# Patient Record
Sex: Female | Born: 1995
Health system: Southern US, Community
[De-identification: ages and names within clinical notes are randomized; demographics above are authoritative.]

## PROBLEM LIST (undated history)

## (undated) DIAGNOSIS — G809 Cerebral palsy, unspecified: Secondary | ICD-10-CM

## (undated) HISTORY — PX: BACK SURGERY: SHX140

---
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
## 2016-01-07 DIAGNOSIS — G808 Other cerebral palsy: Secondary | ICD-10-CM | POA: Diagnosis not present

## 2016-01-17 DIAGNOSIS — G800 Spastic quadriplegic cerebral palsy: Secondary | ICD-10-CM | POA: Diagnosis not present

## 2016-02-19 DIAGNOSIS — G808 Other cerebral palsy: Secondary | ICD-10-CM | POA: Diagnosis not present

## 2016-02-29 ENCOUNTER — Ambulatory Visit (HOSPITAL_COMMUNITY)
Admission: EM | Admit: 2016-02-29 | Discharge: 2016-02-29 | Disposition: A | Discharge status: Home / Self Care | Payer: Federal, State, Local not specified - PPO | Attending: Emergency Medicine | Admitting: Emergency Medicine

## 2016-02-29 ENCOUNTER — Encounter (HOSPITAL_COMMUNITY): Payer: Self-pay | Admitting: Emergency Medicine

## 2016-02-29 DIAGNOSIS — R059 Cough, unspecified: Secondary | ICD-10-CM

## 2016-02-29 DIAGNOSIS — R05 Cough: Secondary | ICD-10-CM | POA: Diagnosis not present

## 2016-02-29 DIAGNOSIS — T700XXA Otitic barotrauma, initial encounter: Secondary | ICD-10-CM | POA: Diagnosis not present

## 2016-02-29 HISTORY — DX: Cerebral palsy, unspecified: G80.9

## 2016-02-29 MED ORDER — CETIRIZINE HCL 5 MG/5ML PO SYRP: 10.0000 mg | ORAL_SOLUTION | Freq: Every day | ORAL | Status: DC

## 2016-02-29 MED ORDER — FLUTICASONE PROPIONATE 50 MCG/ACT NA SUSP: 2.0000 | Freq: Every day | NASAL | Status: DC

## 2016-02-29 NOTE — Discharge Instructions (Signed)
I suspect she is developing an upper respiratory viral infection. There is no sign of pneumonia or ear infection. Give her cetirizine 10 mL daily. Use the Flonase daily. You can alternate Tylenol and ibuprofen as needed for pain. If she develops fevers, vomiting, trouble breathing, or is getting a lot worse, please come back.

## 2016-02-29 NOTE — ED Provider Notes (Signed)
CSN: [id number]     Arrival date & time 02/29/16  1933 History   First MD Initiated Contact with Patient 02/29/16 1943     Chief Complaint  Patient presents with  . Cough   (Consider location/radiation/quality/duration/timing/severity/associated sxs/prior Treatment) HPI  She is an 20 year old woman here with her mom for evaluation of cough and ear pain. History is obtained through the mother. Mom states she developed a cough and bilateral ear pain this afternoon after school. No fevers, nasal congestion, rhinorrhea, sore throat. She denies any shortness of breath or wheezing. No nausea or vomiting.  Past Medical History  Diagnosis Date  . Cerebral palsy [facility](HCC)    Past Surgical History  Procedure Laterality Date  . Back surgery     No family history on file. Social History  Substance Use Topics  . Smoking status: None  . Smokeless tobacco: None  . Alcohol Use: None   OB History    No data available     Review of Systems As in history of present illness Allergies  Review of patient's allergies indicates no known allergies.  Home Medications   Prior to Admission medications   Medication Sig Start Date End Date Taking? Authorizing Provider  baclofen (LIORESAL) 10 MG/5ML SOLN injection by Intrathecal route once.   Yes Historical Provider, MD  glycopyrrolate (ROBINUL) 1 MG tablet Take 1 mg by mouth 3 (three) times daily.   Yes Historical Provider, MD  cetirizine HCl (ZYRTEC) 5 MG/5ML SYRP Take 10 mLs (10 mg total) by mouth daily. 02/29/16   Charm RingsErin J Lachele Lievanos, MD  fluticasone (FLONASE) 50 MCG/ACT nasal spray Place 2 sprays into both nostrils daily. 02/29/16   Charm RingsErin J Shakthi Scipio, MD   Meds Ordered and Administered this Visit  Medications - No data to display  BP 148/95 mmHg  Pulse 120  Temp(Src) 98.1 F (36.7 C) (Oral)  Resp 22  SpO2 100% No data found.   Physical Exam  Constitutional: She is oriented to person, place, and time. She appears well-developed and well-nourished. No  distress.  In a wheelchair with spastic cerebral palsy.  HENT:  Nose: Nose normal.  Mouth/Throat: Oropharynx is clear and moist. No oropharyngeal exudate.  TMs normal bilaterally.  Neck: Neck supple.  Cardiovascular: Normal rate, regular rhythm and normal heart sounds.   No murmur heard. Pulmonary/Chest: Effort normal and breath sounds normal. No respiratory distress. She has no wheezes. She has no rales.  Lymphadenopathy:    She has no cervical adenopathy.  Neurological: She is alert and oriented to person, place, and time.    ED Course  Procedures (including critical care time)  Labs Review Labs Reviewed - No data to display  Imaging Review No results found.   MDM   1. Barotitis, initial encounter   2. Cough    I suspect this may be the start of a viral upper respiratory infection. Recommended cetirizine and Flonase. Mom will observe at home. Return precautions reviewed.    Charm RingsErin J Keonia Pasko, MD 02/29/16 2004

## 2016-02-29 NOTE — ED Notes (Signed)
Mom brings pt in for cough onset yest associated w/bilateral ear pain Mom denies fevers chills.... Pt hx of cerebral palsy... Alert... No acute distress.

## 2016-03-01 DIAGNOSIS — J028 Acute pharyngitis due to other specified organisms: Secondary | ICD-10-CM | POA: Diagnosis not present

## 2016-03-26 DIAGNOSIS — G800 Spastic quadriplegic cerebral palsy: Secondary | ICD-10-CM | POA: Diagnosis not present

## 2016-04-02 DIAGNOSIS — G800 Spastic quadriplegic cerebral palsy: Secondary | ICD-10-CM | POA: Diagnosis not present

## 2016-04-09 DIAGNOSIS — G800 Spastic quadriplegic cerebral palsy: Secondary | ICD-10-CM | POA: Diagnosis not present

## 2016-04-16 DIAGNOSIS — G800 Spastic quadriplegic cerebral palsy: Secondary | ICD-10-CM | POA: Diagnosis not present

## 2016-04-23 DIAGNOSIS — G800 Spastic quadriplegic cerebral palsy: Secondary | ICD-10-CM | POA: Diagnosis not present

## 2016-05-14 DIAGNOSIS — G800 Spastic quadriplegic cerebral palsy: Secondary | ICD-10-CM | POA: Diagnosis not present

## 2016-06-04 DIAGNOSIS — G800 Spastic quadriplegic cerebral palsy: Secondary | ICD-10-CM | POA: Diagnosis not present

## 2016-06-11 DIAGNOSIS — G800 Spastic quadriplegic cerebral palsy: Secondary | ICD-10-CM | POA: Diagnosis not present

## 2016-06-18 DIAGNOSIS — G800 Spastic quadriplegic cerebral palsy: Secondary | ICD-10-CM | POA: Diagnosis not present

## 2016-06-23 DIAGNOSIS — K080 Exfoliation of teeth due to systemic causes: Secondary | ICD-10-CM | POA: Diagnosis not present

## 2016-06-25 DIAGNOSIS — G800 Spastic quadriplegic cerebral palsy: Secondary | ICD-10-CM | POA: Diagnosis not present

## 2016-07-02 DIAGNOSIS — G800 Spastic quadriplegic cerebral palsy: Secondary | ICD-10-CM | POA: Diagnosis not present

## 2016-07-04 DIAGNOSIS — Z23 Encounter for immunization: Secondary | ICD-10-CM | POA: Diagnosis not present

## 2016-07-09 DIAGNOSIS — G800 Spastic quadriplegic cerebral palsy: Secondary | ICD-10-CM | POA: Diagnosis not present

## 2016-07-16 DIAGNOSIS — G800 Spastic quadriplegic cerebral palsy: Secondary | ICD-10-CM | POA: Diagnosis not present

## 2016-07-30 DIAGNOSIS — G800 Spastic quadriplegic cerebral palsy: Secondary | ICD-10-CM | POA: Diagnosis not present

## 2016-08-06 DIAGNOSIS — G800 Spastic quadriplegic cerebral palsy: Secondary | ICD-10-CM | POA: Diagnosis not present

## 2016-08-13 DIAGNOSIS — G800 Spastic quadriplegic cerebral palsy: Secondary | ICD-10-CM | POA: Diagnosis not present

## 2016-08-14 DIAGNOSIS — L709 Acne, unspecified: Secondary | ICD-10-CM | POA: Diagnosis not present

## 2016-08-20 DIAGNOSIS — G800 Spastic quadriplegic cerebral palsy: Secondary | ICD-10-CM | POA: Diagnosis not present

## 2016-09-10 DIAGNOSIS — G800 Spastic quadriplegic cerebral palsy: Secondary | ICD-10-CM | POA: Diagnosis not present

## 2016-09-17 DIAGNOSIS — G800 Spastic quadriplegic cerebral palsy: Secondary | ICD-10-CM | POA: Diagnosis not present

## 2016-09-25 DIAGNOSIS — G800 Spastic quadriplegic cerebral palsy: Secondary | ICD-10-CM | POA: Diagnosis not present

## 2016-10-08 DIAGNOSIS — G800 Spastic quadriplegic cerebral palsy: Secondary | ICD-10-CM | POA: Diagnosis not present

## 2016-10-15 DIAGNOSIS — G800 Spastic quadriplegic cerebral palsy: Secondary | ICD-10-CM | POA: Diagnosis not present

## 2016-10-29 DIAGNOSIS — G800 Spastic quadriplegic cerebral palsy: Secondary | ICD-10-CM | POA: Diagnosis not present

## 2016-11-12 DIAGNOSIS — G800 Spastic quadriplegic cerebral palsy: Secondary | ICD-10-CM | POA: Diagnosis not present

## 2016-12-03 DIAGNOSIS — G800 Spastic quadriplegic cerebral palsy: Secondary | ICD-10-CM | POA: Diagnosis not present

## 2016-12-10 ENCOUNTER — Encounter (HOSPITAL_COMMUNITY): Payer: Self-pay | Admitting: *Deleted

## 2016-12-10 ENCOUNTER — Ambulatory Visit (HOSPITAL_COMMUNITY)
Admission: EM | Admit: 2016-12-10 | Discharge: 2016-12-10 | Disposition: A | Discharge status: Home / Self Care | Payer: Federal, State, Local not specified - PPO

## 2016-12-10 DIAGNOSIS — J069 Acute upper respiratory infection, unspecified: Secondary | ICD-10-CM

## 2016-12-10 NOTE — ED Provider Notes (Signed)
CSN: [id number]     Arrival date & time 12/10/16  1652 History   First MD Initiated Contact with Patient 12/10/16 1834     No chief complaint on file.  (Consider location/radiation/quality/duration/timing/severity/associated sxs/prior Treatment) 21 year old female with cerebral palsy brought in by her caregiver with concern over bilateral ear pain, sore throat and cough that started today. Denies any fever, nasal congestion or GI symptoms. Has not taken anything yet for symptoms. Occasionally takes Flonase for seasonal allergies or URI.    The history is provided by a caregiver.    Past Medical History:  Diagnosis Date  . Cerebral palsy [facility])    Past Surgical History:  Procedure Laterality Date  . BACK SURGERY     History reviewed. No pertinent family history. Social History  Substance Use Topics  . Smoking status: Never Smoker  . Smokeless tobacco: Never Used  . Alcohol use No   OB History    No data available     Review of Systems  Constitutional: Negative for appetite change, chills, fatigue and fever.  HENT: Positive for ear pain and sore throat. Negative for congestion, ear discharge, postnasal drip, sinus pain and sinus pressure.   Eyes: Negative for discharge.  Respiratory: Positive for cough. Negative for chest tightness and wheezing.   Cardiovascular: Negative for chest pain.  Gastrointestinal: Negative for diarrhea, nausea and vomiting.  Musculoskeletal: Negative for arthralgias, back pain, myalgias and neck pain.  Skin: Negative for rash and wound.  Neurological: Negative for syncope and headaches.  Hematological: Negative for adenopathy.    Allergies  Patient has no known allergies.  Home Medications   Prior to Admission medications   Medication Sig Start Date End Date Taking? Authorizing Provider  clindamycin (CLEOCIN T) 1 % lotion Apply topically 2 (two) times daily.   Yes Historical Provider, MD  fluticasone (FLONASE) 50 MCG/ACT nasal spray Place 2  sprays into both nostrils daily. 02/29/16   Charm Rings, MD  glycopyrrolate (ROBINUL) 1 MG tablet Take 1 mg by mouth 3 (three) times daily.    Historical Provider, MD   Meds Ordered and Administered this Visit  Medications - No data to display  BP 135/78 (BP Location: Left Arm)   Pulse 100   Temp 98.6 F (37 C) (Oral)   Resp 18   LMP 12/09/2016  No data found.   Physical Exam  Constitutional: She appears well-developed and well-nourished. No distress.  Patient resting comfortably in her wheelchair.   HENT:  Head: Atraumatic. Macrocephalic.  Right Ear: Hearing, external ear and ear canal normal. Tympanic membrane is bulging.  Left Ear: Hearing, external ear and ear canal normal. Tympanic membrane is bulging.  Nose: Rhinorrhea present. Right sinus exhibits no maxillary sinus tenderness and no frontal sinus tenderness. Left sinus exhibits no maxillary sinus tenderness and no frontal sinus tenderness.  Mouth/Throat: Uvula is midline and mucous membranes are normal. Posterior oropharyngeal erythema present. Oropharyngeal exudate: clear to yellow post nasal drainage.  Neck: Normal range of motion. Neck supple.  Cardiovascular: Normal rate.   Murmur heard. Pulmonary/Chest: Effort normal and breath sounds normal. No respiratory distress. She has no decreased breath sounds. She has no wheezes. She has no rhonchi.  Lymphadenopathy:    She has no cervical adenopathy.  Neurological: She is alert. She displays atrophy. She exhibits abnormal muscle tone. Coordination abnormal.  Skin: Skin is warm and dry.  Psychiatric: She has a normal mood and affect.    Urgent Care Course  Procedures (including critical care time)  Labs Review Labs Reviewed - No data to display  Imaging Review No results found.   Visual Acuity Review  Right Eye Distance:   Left Eye Distance:   Bilateral Distance:    Right Eye Near:   Left Eye Near:    Bilateral Near:         MDM   1. Upper  respiratory tract infection, unspecified type    Reviewed with caregiver that she probably has a viral illness. Recommend try OTC Triaminic cold medication as directed to help with symptoms. May take Ibuprofen 600mg  every 8 hours as needed for ear pain and sore throat. Rest. May Continue Flonase as directed. Follow-up in 4 to 5 days if not improving.     Sudie GrumblingAnn Berry Lucciana Head, NP 12/10/16 2308

## 2016-12-10 NOTE — Discharge Instructions (Signed)
Recommend take liquid Triaminic cold and cough medication (with a decongestant) as directed to help with symptoms. Rest. Continue Flonase as directed. Follow-up if not improving in 4 to 5 days.

## 2016-12-17 DIAGNOSIS — G800 Spastic quadriplegic cerebral palsy: Secondary | ICD-10-CM | POA: Diagnosis not present

## 2016-12-22 DIAGNOSIS — K080 Exfoliation of teeth due to systemic causes: Secondary | ICD-10-CM | POA: Diagnosis not present

## 2016-12-24 DIAGNOSIS — G800 Spastic quadriplegic cerebral palsy: Secondary | ICD-10-CM | POA: Diagnosis not present

## 2016-12-31 DIAGNOSIS — G800 Spastic quadriplegic cerebral palsy: Secondary | ICD-10-CM | POA: Diagnosis not present

## 2017-01-12 DIAGNOSIS — G808 Other cerebral palsy: Secondary | ICD-10-CM | POA: Diagnosis not present

## 2017-01-14 DIAGNOSIS — G800 Spastic quadriplegic cerebral palsy: Secondary | ICD-10-CM | POA: Diagnosis not present

## 2017-01-21 DIAGNOSIS — G800 Spastic quadriplegic cerebral palsy: Secondary | ICD-10-CM | POA: Diagnosis not present

## 2017-02-11 DIAGNOSIS — G800 Spastic quadriplegic cerebral palsy: Secondary | ICD-10-CM | POA: Diagnosis not present

## 2017-02-18 DIAGNOSIS — G800 Spastic quadriplegic cerebral palsy: Secondary | ICD-10-CM | POA: Diagnosis not present

## 2017-02-19 DIAGNOSIS — G808 Other cerebral palsy: Secondary | ICD-10-CM | POA: Diagnosis not present

## 2017-02-25 DIAGNOSIS — G800 Spastic quadriplegic cerebral palsy: Secondary | ICD-10-CM | POA: Diagnosis not present

## 2017-03-04 DIAGNOSIS — G800 Spastic quadriplegic cerebral palsy: Secondary | ICD-10-CM | POA: Diagnosis not present

## 2017-03-13 ENCOUNTER — Ambulatory Visit (HOSPITAL_COMMUNITY): Payer: Federal, State, Local not specified - PPO

## 2017-03-13 ENCOUNTER — Ambulatory Visit (INDEPENDENT_AMBULATORY_CARE_PROVIDER_SITE_OTHER): Payer: Federal, State, Local not specified - PPO

## 2017-03-13 ENCOUNTER — Ambulatory Visit (HOSPITAL_COMMUNITY)
Admission: EM | Admit: 2017-03-13 | Discharge: 2017-03-13 | Disposition: A | Discharge status: Home / Self Care | Payer: Federal, State, Local not specified - PPO | Attending: Emergency Medicine | Admitting: Emergency Medicine

## 2017-03-13 ENCOUNTER — Encounter (HOSPITAL_COMMUNITY): Payer: Self-pay | Admitting: *Deleted

## 2017-03-13 DIAGNOSIS — G808 Other cerebral palsy: Secondary | ICD-10-CM | POA: Diagnosis not present

## 2017-03-13 DIAGNOSIS — J302 Other seasonal allergic rhinitis: Secondary | ICD-10-CM

## 2017-03-13 DIAGNOSIS — J301 Allergic rhinitis due to pollen: Secondary | ICD-10-CM | POA: Diagnosis not present

## 2017-03-13 DIAGNOSIS — R05 Cough: Secondary | ICD-10-CM

## 2017-03-13 DIAGNOSIS — R059 Cough, unspecified: Secondary | ICD-10-CM | POA: Diagnosis present

## 2017-03-13 NOTE — ED Triage Notes (Signed)
Pt  Reports   Symptoms     Of  Cough   And      Congested   For   sev  Days    The  Cough is  Non  Productive          Pt  Is   Wheelchair      Bound

## 2017-03-13 NOTE — ED Provider Notes (Signed)
CSN: [id number]     Arrival date & time 03/13/17  1121 History   None    Chief Complaint  Patient presents with  . Cough   (Consider location/radiation/quality/duration/timing/severity/associated sxs/prior Treatment) The history is provided by the patient. No language interpreter was used.  Cough  Cough characteristics:  Non-productive and dry Severity:  Mild Onset quality:  Gradual Timing:  Intermittent Progression:  Waxing and waning Chronicity:  Recurrent Smoker: no   Context: weather changes   Relieved by:  Nothing Worsened by:  Nothing Ineffective treatments:  Cough suppressants Associated symptoms: sinus congestion   Associated symptoms: no chills, no fever and no wheezing     Past Medical History:  Diagnosis Date  . Cerebral palsy [facility])    Past Surgical History:  Procedure Laterality Date  . BACK SURGERY     History reviewed. No pertinent family history. Social History  Substance Use Topics  . Smoking status: Never Smoker  . Smokeless tobacco: Never Used  . Alcohol use No   OB History    No data available     Review of Systems  Constitutional: Negative for chills and fever.  HENT: Negative.   Eyes: Negative.   Respiratory: Positive for cough. Negative for wheezing.   Cardiovascular: Negative.   Gastrointestinal: Negative.   Endocrine: Negative.   Genitourinary: Negative.   Musculoskeletal: Negative.   Skin: Negative.   Allergic/Immunologic: Negative.   Neurological: Negative.   Hematological: Negative.   Psychiatric/Behavioral: Negative.   All other systems reviewed and are negative.   Allergies  Patient has no known allergies.  Home Medications   Prior to Admission medications   Medication Sig Start Date End Date Taking? Authorizing Provider  clindamycin (CLEOCIN T) 1 % lotion Apply topically 2 (two) times daily.    [provider]  fluticasone (FLONASE) 50 MCG/ACT nasal spray Place 2 sprays into both nostrils daily. 02/29/16    Charm Rings, MD  glycopyrrolate (ROBINUL) 1 MG tablet Take 1 mg by mouth 3 (three) times daily.    [provider]   Meds Ordered and Administered this Visit  Medications - No data to display  BP 108/60 (BP Location: Left Arm)   Pulse (!) 102   Temp 98.6 F (37 C) (Oral)   Resp 18   LMP 03/09/2017 (Exact Date)   SpO2 100%  No data found.   Physical Exam  Constitutional: She is oriented to person, place, and time. She appears well-nourished. She is active and cooperative. No distress.  HENT:  Head: Normocephalic.  Right Ear: Tympanic membrane normal.  Left Ear: Tympanic membrane normal.  Nose: Mucosal edema present.  Mouth/Throat: Uvula is midline, oropharynx is clear and moist and mucous membranes are normal.  Eyes: Conjunctivae, EOM and lids are normal. Pupils are equal, round, and reactive to light.  Neck: Normal range of motion. No tracheal deviation present.  Cardiovascular: Regular rhythm, normal heart sounds and normal pulses.   No murmur heard. Pulmonary/Chest: Effort normal and breath sounds normal.  Abdominal: Soft. Bowel sounds are normal. There is no tenderness.  Musculoskeletal: Normal range of motion.  Lymphadenopathy:    She has no cervical adenopathy.  Neurological: She is alert and oriented to person, place, and time. GCS eye subscore is 4. GCS verbal subscore is 5. GCS motor subscore is 6.  Skin: Skin is warm and dry. No rash noted.  Psychiatric: She has a normal mood and affect. Her speech is normal and behavior is normal.  Nursing note and  vitals reviewed.   Urgent Care Course     Procedures (including critical care time)  Labs Review Labs Reviewed - No data to display  Imaging Review Dg Chest 1 View  Result Date: 03/13/2017 CLINICAL DATA:  Cough EXAM: CHEST 1 VIEW COMPARISON:  None. FINDINGS: Lungs are clear. No effusion or pneumothorax. Prominence of the main pulmonary artery contour, but hilar vessels are unremarkable and there is  normal vascularity. Spinal fixation hardware. History of cerebral palsy. IMPRESSION: No acute finding.  No evidence of pneumonia. Electronically Signed   By: Marnee SpringJonathon  Watts M.D.   On: 03/13/2017 12:53        MDM   1. Seasonal allergic rhinitis due to pollen   2. Cough     Your CXR was negative for pneumonia. Rest,push fluids, take OTC allergy med of choice(zyrtec, Claritin, allegra, etc). Follow up with PCP for general medical issues/recheck. Return to UC as needed.    Clancy Gourdefelice, Yun Gutierrez, NP 03/13/17 1329

## 2017-03-13 NOTE — Discharge Instructions (Signed)
Your CXR was negative for pneumonia. Rest,push fluids, take OTC allergy med of choice(zyrtec, Claritin, allegra, etc). Follow up with PCP for general medical issues/recheck. Return to UC as needed.

## 2017-03-18 DIAGNOSIS — G800 Spastic quadriplegic cerebral palsy: Secondary | ICD-10-CM | POA: Diagnosis not present

## 2017-03-25 DIAGNOSIS — G800 Spastic quadriplegic cerebral palsy: Secondary | ICD-10-CM | POA: Diagnosis not present

## 2017-04-10 DIAGNOSIS — G800 Spastic quadriplegic cerebral palsy: Secondary | ICD-10-CM | POA: Diagnosis not present

## 2017-04-13 ENCOUNTER — Ambulatory Visit (INDEPENDENT_AMBULATORY_CARE_PROVIDER_SITE_OTHER): Payer: Federal, State, Local not specified - PPO | Admitting: Family Medicine

## 2017-04-13 ENCOUNTER — Encounter: Payer: Self-pay | Admitting: Family Medicine

## 2017-04-13 ENCOUNTER — Telehealth: Payer: Self-pay

## 2017-04-13 ENCOUNTER — Ambulatory Visit (INDEPENDENT_AMBULATORY_CARE_PROVIDER_SITE_OTHER): Payer: Federal, State, Local not specified - PPO

## 2017-04-13 VITALS — BP 112/70 | HR 80 | Temp 98.4°F | Wt <= 1120 oz

## 2017-04-13 DIAGNOSIS — H6592 Unspecified nonsuppurative otitis media, left ear: Secondary | ICD-10-CM

## 2017-04-13 DIAGNOSIS — R05 Cough: Secondary | ICD-10-CM

## 2017-04-13 DIAGNOSIS — R059 Cough, unspecified: Secondary | ICD-10-CM

## 2017-04-13 MED ORDER — CETIRIZINE HCL 5 MG/5ML PO SOLN: 10.0000 mg | Freq: Every day | ORAL | 2 refills | Status: AC

## 2017-04-13 MED ORDER — AZITHROMYCIN 200 MG/5ML PO SUSR: 250.0000 mg | Freq: Every day | ORAL | 0 refills | Status: AC

## 2017-04-13 NOTE — Telephone Encounter (Signed)
Patient's pharmacy called to ask how many days the patient is supposed to take azithromycin for.  Per Dr. Earlene PlaterWallace, 5 days.  Pharmacy notified.

## 2017-04-13 NOTE — Progress Notes (Signed)
Renee Everett is a 21 y.o. female is here to [facility].   Patient Care Team: Renee Rima, DO as PCP - General (Family Medicine)   History of Present Illness:   Joseph Art, CMA, acting as scribe for Dr. Earlene Plater.  Cough  This is a new problem. The current episode started more than 1 month ago. The problem has been unchanged. The problem occurs every few hours. The cough is non-productive. Pertinent negatives include no chest pain, chills, fever, nasal congestion or sore throat. The symptoms are aggravated by lying down. She has tried OTC cough suppressant for the symptoms. The treatment provided no relief.   Health Maintenance Due  Topic Date Due  . HIV Screening  07/31/2012  . TETANUS/TDAP  07/31/2016   PMHx, SurgHx, SocialHx, Medications, and Allergies were reviewed in the Visit Navigator and updated as appropriate.   Past Medical History:  Diagnosis Date  . Cerebral palsy [facility])    Past Surgical History:  Procedure Laterality Date  . BACK SURGERY      Family History  Problem Relation Age of Onset  . Hypertension Father    Social History  Substance Use Topics  . Smoking status: Never Smoker  . Smokeless tobacco: Never Used  . Alcohol use No   Current Medications and Allergies:   .  clindamycin (CLEOCIN T) 1 % lotion, Apply topically 2 (two) times daily., Disp: , Rfl:  .  glycopyrrolate (ROBINUL) 1 MG tablet, Take 1 mg by mouth 3 (three) times daily., Disp: , Rfl:   No Known Allergies   Review of Systems:   Review of Systems  Constitutional: Negative for chills and fever.  HENT: Negative for sore throat.   Respiratory: Positive for cough.   Cardiovascular: Negative for chest pain.   Vitals:   Vitals:   04/13/17 0951  BP: 112/70  Pulse: 80  Temp: 98.4 F (36.9 C)  TempSrc: Oral  SpO2: 97%  Weight: 65 lb (29.5 kg)     There is no height or weight on file to calculate BMI.  Physical Exam:   Physical Exam  Constitutional: She appears  well-nourished. No distress.  CP, alert, in wheelchair.  HENT:  Head: Normocephalic and atraumatic.  Right Ear: External ear normal.  Left Ear: Tympanic membrane is bulging.  Nose: Rhinorrhea present.  Mouth/Throat: Oropharynx is clear and moist and mucous membranes are normal.  Eyes: Pupils are equal, round, and reactive to light. EOM are normal.  Neck: Normal range of motion. Neck supple.  Cardiovascular: Normal rate, regular rhythm, normal heart sounds and intact distal pulses.   Pulmonary/Chest: Effort normal. No respiratory distress. She has no wheezes.  Abdominal: Soft. Normal appearance and bowel sounds are normal.  No PEG.  Musculoskeletal:  Contractures arms and legs.  Neurological: She is alert.  Skin: Skin is warm. No rash noted.  Psychiatric: She has a normal mood and affect. Her behavior is normal.  Nursing note and vitals reviewed.  EXAM: CHEST  2 VIEW  COMPARISON:  03/13/2017  FINDINGS: Heart and mediastinal contours are within normal limits. No focal opacities or effusions. No acute bony abnormality. Posterior spinal rods in place.  IMPRESSION: No active cardiopulmonary disease.  Assessment and Plan:   Marlean was seen today for establish care and cough.  Diagnoses and all orders for this visit:  Cough Comments: Prolonged cough with OM at this point. Will cover with atypical Abx below. Red flags reviewed. Ray reassuring. Aspiration precautions reviewed. Orders: -  DG Chest 2 View; Future -     azithromycin (ZITHROMAX) 200 MG/5ML suspension; Take 6.3 mLs (250 mg total) by mouth daily. -     cetirizine HCl (ZYRTEC) 5 MG/5ML SOLN; Take 10 mLs (10 mg total) by mouth daily.  OME (otitis media with effusion), left -     azithromycin (ZITHROMAX) 200 MG/5ML suspension; Take 6.3 mLs (250 mg total) by mouth daily.   . Reviewed expectations re: course of current medical issues. . Discussed self-management of symptoms. . Outlined signs and symptoms  indicating need for more acute intervention. . Patient verbalized understanding and all questions were answered. Marland Kitchen. Health Maintenance issues including appropriate healthy diet, exercise, and smoking avoidance were discussed with patient. . See orders for this visit as documented in the electronic medical record. . Patient received an After Visit Summary.  CMA served as Neurosurgeonscribe during this visit. History, Physical, and Plan performed by medical provider. The above documentation has been reviewed and is accurate and complete. Renee RimaErica Cyara Devoto, D.O.   Renee RimaErica Tatelyn Vanhecke, DO [place] [facility], [facility] 04/18/2017

## 2017-04-15 DIAGNOSIS — G800 Spastic quadriplegic cerebral palsy: Secondary | ICD-10-CM | POA: Diagnosis not present

## 2017-04-22 ENCOUNTER — Telehealth: Payer: Self-pay | Admitting: Family Medicine

## 2017-04-22 DIAGNOSIS — G800 Spastic quadriplegic cerebral palsy: Secondary | ICD-10-CM | POA: Diagnosis not present

## 2017-04-22 NOTE — Telephone Encounter (Signed)
Spoke with patients mother and she stated that her daughter still has the cough. She states that her daughter is sleeping more now as well. She finished the antibiotic and cough went away for about a day then came back as a hacking cough. Denies any fever and ear pain. She did say that she has noticed that she is itching a lot. Please advise.

## 2017-04-22 NOTE — Telephone Encounter (Signed)
Patient's mother calling with concern about patient. States after taking all of the antibiotics she was prescribed, patient is still not feeling better. Asking for return call to discuss next course of action.

## 2017-04-23 NOTE — Telephone Encounter (Signed)
Noted  

## 2017-04-23 NOTE — Telephone Encounter (Signed)
Patient's mother called in reference to note below. Informed patient's mother that Renee Everett was out of the office for the rest of the afternoon and will be returning in the morning.

## 2017-04-23 NOTE — Telephone Encounter (Signed)
Call first thing in the morning to check in. If she can - okay to work in. If not - ask for an update.

## 2017-04-24 ENCOUNTER — Encounter: Payer: Self-pay | Admitting: Physician Assistant

## 2017-04-24 ENCOUNTER — Ambulatory Visit (INDEPENDENT_AMBULATORY_CARE_PROVIDER_SITE_OTHER): Payer: Federal, State, Local not specified - PPO | Admitting: Physician Assistant

## 2017-04-24 VITALS — BP 104/62 | HR 72 | Temp 98.0°F

## 2017-04-24 DIAGNOSIS — R05 Cough: Secondary | ICD-10-CM | POA: Diagnosis not present

## 2017-04-24 DIAGNOSIS — R059 Cough, unspecified: Secondary | ICD-10-CM

## 2017-04-24 MED ORDER — RANITIDINE HCL 150 MG/10ML PO SYRP: 150.0000 mg | ORAL_SOLUTION | Freq: Two times a day (BID) | ORAL | 0 refills | Status: AC

## 2017-04-24 NOTE — Progress Notes (Signed)
Renee Everett is a 21 y.o. female here for a follow up of a pre-existing problem.  SCRIBE STATEMENT  History of Present Illness:   Chief Complaint  Patient presents with  . Cough    Has been treated with ABX. finished a week ago. Still has the cough.     HPI   Patient has had a cough x 2 months. She has been treated with Azithromycin and Zyrtec. CXR on 04/13/17 was negative. She completed the antibiotic dose pack. Was seen by her PCP for this on 04/13/17. Mom reports that patient continues to have lingering cough, worse at night. No more fevers. No productive cough. No cyanosis or SOB. She does have a history of heartburn, currently untreated. She does eat a lot of heavy and acidic foods such as pizza and lasagna. She often lays down right after eating. Denies changes in bowels/urine, n/v, abdominal pain.   Past Medical History:  Diagnosis Date  . Cerebral palsy [facility](HCC)      Social History   Social History  . Marital status: Single    Spouse name: N/A  . Number of children: N/A  . Years of education: N/A   Occupational History  . Not on file.   Social History Main Topics  . Smoking status: Never Smoker  . Smokeless tobacco: Never Used  . Alcohol use No  . Drug use: No  . Sexual activity: No   Other Topics Concern  . Not on file   Social History Narrative  . No narrative on file    Past Surgical History:  Procedure Laterality Date  . BACK SURGERY      Family History  Problem Relation Age of Onset  . Hypertension Father     No Known Allergies  Current Medications:   Current Outpatient Prescriptions:  .  cetirizine HCl (ZYRTEC) 5 MG/5ML SOLN, Take 10 mLs (10 mg total) by mouth daily., Disp: 300 mL, Rfl: 2 .  clindamycin (CLEOCIN T) 1 % lotion, Apply topically 2 (two) times daily., Disp: , Rfl:  .  glycopyrrolate (ROBINUL) 1 MG tablet, Take 1 mg by mouth 3 (three) times daily., Disp: , Rfl:  .  ranitidine (ZANTAC) 150 MG/10ML syrup, Take 10 mLs (150 mg total) by  mouth 2 (two) times daily., Disp: 300 mL, Rfl: 0   Review of Systems:   Review of Systems  Constitutional: Negative for chills, fever, malaise/fatigue and weight loss.  Respiratory: Positive for cough. Negative for hemoptysis, sputum production, shortness of breath and wheezing.   Cardiovascular: Negative for chest pain.  Gastrointestinal: Negative for abdominal pain, diarrhea, nausea and vomiting.  Genitourinary: Negative for dysuria.    Vitals:   Vitals:   04/24/17 1400  BP: 104/62  Pulse: 72  Temp: 98 F (36.7 C)  TempSrc: Oral  SpO2: 100%     There is no height or weight on file to calculate BMI.  Physical Exam:   Physical Exam  Constitutional: She appears well-developed. She is cooperative.  Non-toxic appearance. She does not have a sickly appearance. She does not appear ill. No distress.  Cardiovascular: Normal rate, regular rhythm, S1 normal, S2 normal, normal heart sounds and normal pulses.   No LE edema  Pulmonary/Chest: Effort normal and breath sounds normal.  Abdominal: Soft. Normal appearance and bowel sounds are normal. There is no rigidity, no rebound and no guarding.  Neurological: She is alert. GCS eye subscore is 4. GCS verbal subscore is 5. GCS motor subscore is 6.  Skin: Skin  is warm, dry and intact.  Psychiatric: She has a normal mood and affect. Her speech is normal and behavior is normal.  Nursing note and vitals reviewed.     Assessment and Plan:    Sophea was seen today for cough.  Diagnoses and all orders for this visit:  Cough  Other orders -     ranitidine (ZANTAC) 150 MG/10ML syrup; Take 10 mLs (150 mg total) by mouth 2 (two) times daily.   Will treat cough with liquid Zyrtec per orders. I have recommended small meals, and limiting heavy/acidic foods when possible. Avoid laying down right after eating. Consider use of Delsym or Mucinex -- discussed with mom. If cough persists despite treatment, would recommend pulmonary evaluation.  Follow-up prn with PCP.  Marland Kitchen Reviewed expectations re: course of current medical issues. . Discussed self-management of symptoms. . Outlined signs and symptoms indicating need for more acute intervention. . Patient verbalized understanding and all questions were answered. . See orders for this visit as documented in the electronic medical record. . Patient received an After-Visit Summary.  CMA or LPN served as scribe during this visit. History, Physical, and Plan performed by medical provider. Documentation and orders reviewed and attested to.  Jarold Motto, PA-C

## 2017-04-24 NOTE — Telephone Encounter (Signed)
I have scheduled patient to come in to see Lelon MastSamantha today at 2 PM.

## 2017-04-24 NOTE — Telephone Encounter (Signed)
Left message for patient to return call.

## 2017-04-24 NOTE — Patient Instructions (Signed)
It was great to meet you!  Start the Zantac twice a day, take before meals.  Consider Mucinex to help with chest congestion.  Follow-up with Dr. Earlene Plater if symptoms worsen or persist despite treatment.   Heartburn Heartburn is a type of pain or discomfort that can happen in the throat or chest. It is often described as a burning pain. It may also cause a bad taste in the mouth. Heartburn may feel worse when you lie down or bend over, and it is often worse at night. Heartburn may be caused by stomach contents that move back up into the esophagus (reflux). Follow these instructions at home: Take these actions to decrease your discomfort and to help avoid complications. Diet  Follow a diet as recommended by your health care provider. This may involve avoiding foods and drinks such as: ? Coffee and tea (with or without caffeine). ? Drinks that contain alcohol. ? Energy drinks and sports drinks. ? Carbonated drinks or sodas. ? Chocolate and cocoa. ? Peppermint and mint flavorings. ? Garlic and onions. ? Horseradish. ? Spicy and acidic foods, including peppers, chili powder, curry powder, vinegar, hot sauces, and barbecue sauce. ? Citrus fruit juices and citrus fruits, such as oranges, lemons, and limes. ? Tomato-based foods, such as red sauce, chili, salsa, and pizza with red sauce. ? Fried and fatty foods, such as donuts, french fries, potato chips, and high-fat dressings. ? High-fat meats, such as hot dogs and fatty cuts of red and white meats, such as rib eye steak, sausage, ham, and bacon. ? High-fat dairy items, such as whole milk, butter, and cream cheese.  Eat small, frequent meals instead of large meals.  Avoid drinking large amounts of liquid with your meals.  Avoid eating meals during the 2-3 hours before bedtime.  Avoid lying down right after you eat.  Do not exercise right after you eat. General instructions  Pay attention to any changes in your symptoms.  Take  over-the-counter and prescription medicines only as told by your health care provider. Do not take aspirin, ibuprofen, or other NSAIDs unless your health care provider told you to do so.  Do not use any tobacco products, including cigarettes, chewing tobacco, and e-cigarettes. If you need help quitting, ask your health care provider.  Wear loose-fitting clothing. Do not wear anything tight around your waist that causes pressure on your abdomen.  Raise (elevate) the head of your bed about 6 inches (15 cm).  Try to reduce your stress, such as with yoga or meditation. If you need help reducing stress, ask your health care provider.  If you are overweight, reduce your weight to an amount that is healthy for you. Ask your health care provider for guidance about a safe weight loss goal.  Keep all follow-up visits as told by your health care provider. This is important. Contact a health care provider if:  You have new symptoms.  You have unexplained weight loss.  You have difficulty swallowing, or it hurts to swallow.  You have wheezing or a persistent cough.  Your symptoms do not improve with treatment.  You have frequent heartburn for more than two weeks. Get help right away if:  You have pain in your arms, neck, jaw, teeth, or back.  You feel sweaty, dizzy, or light-headed.  You have chest pain or shortness of breath.  You vomit and your vomit looks like blood or coffee grounds.  Your stool is bloody or black. This information is not intended to replace  advice given to you by your health care provider. Make sure you discuss any questions you have with your health care provider. Document Released: 02/08/2009 Document Revised: 02/28/2016 Document Reviewed: 01/17/2015 Elsevier Interactive Patient Education  2017 [employer]

## 2017-04-29 DIAGNOSIS — G800 Spastic quadriplegic cerebral palsy: Secondary | ICD-10-CM | POA: Diagnosis not present

## 2017-05-06 DIAGNOSIS — G800 Spastic quadriplegic cerebral palsy: Secondary | ICD-10-CM | POA: Diagnosis not present

## 2017-05-12 DIAGNOSIS — Z83511 Family history of glaucoma: Secondary | ICD-10-CM | POA: Diagnosis not present

## 2017-05-12 DIAGNOSIS — H53022 Refractive amblyopia, left eye: Secondary | ICD-10-CM | POA: Diagnosis not present

## 2017-05-13 DIAGNOSIS — G800 Spastic quadriplegic cerebral palsy: Secondary | ICD-10-CM | POA: Diagnosis not present

## 2017-05-19 DIAGNOSIS — G800 Spastic quadriplegic cerebral palsy: Secondary | ICD-10-CM | POA: Diagnosis not present

## 2017-06-10 DIAGNOSIS — G800 Spastic quadriplegic cerebral palsy: Secondary | ICD-10-CM | POA: Diagnosis not present

## 2017-06-17 DIAGNOSIS — G800 Spastic quadriplegic cerebral palsy: Secondary | ICD-10-CM | POA: Diagnosis not present

## 2017-06-24 DIAGNOSIS — G800 Spastic quadriplegic cerebral palsy: Secondary | ICD-10-CM | POA: Diagnosis not present

## 2017-06-29 DIAGNOSIS — K080 Exfoliation of teeth due to systemic causes: Secondary | ICD-10-CM | POA: Diagnosis not present

## 2017-07-01 DIAGNOSIS — G800 Spastic quadriplegic cerebral palsy: Secondary | ICD-10-CM | POA: Diagnosis not present

## 2017-07-08 DIAGNOSIS — G800 Spastic quadriplegic cerebral palsy: Secondary | ICD-10-CM | POA: Diagnosis not present

## 2017-07-14 DIAGNOSIS — G800 Spastic quadriplegic cerebral palsy: Secondary | ICD-10-CM | POA: Diagnosis not present

## 2017-07-15 DIAGNOSIS — G800 Spastic quadriplegic cerebral palsy: Secondary | ICD-10-CM | POA: Diagnosis not present

## 2017-07-23 DIAGNOSIS — G800 Spastic quadriplegic cerebral palsy: Secondary | ICD-10-CM | POA: Diagnosis not present

## 2017-07-23 DIAGNOSIS — G808 Other cerebral palsy: Secondary | ICD-10-CM | POA: Diagnosis not present

## 2017-07-27 ENCOUNTER — Ambulatory Visit (INDEPENDENT_AMBULATORY_CARE_PROVIDER_SITE_OTHER): Payer: Federal, State, Local not specified - PPO | Admitting: *Deleted

## 2017-07-27 DIAGNOSIS — Z23 Encounter for immunization: Secondary | ICD-10-CM

## 2017-08-12 DIAGNOSIS — G800 Spastic quadriplegic cerebral palsy: Secondary | ICD-10-CM | POA: Diagnosis not present

## 2017-08-13 DIAGNOSIS — G800 Spastic quadriplegic cerebral palsy: Secondary | ICD-10-CM | POA: Diagnosis not present

## 2017-08-19 DIAGNOSIS — G800 Spastic quadriplegic cerebral palsy: Secondary | ICD-10-CM | POA: Diagnosis not present

## 2017-09-09 DIAGNOSIS — G800 Spastic quadriplegic cerebral palsy: Secondary | ICD-10-CM | POA: Diagnosis not present

## 2017-09-16 DIAGNOSIS — G800 Spastic quadriplegic cerebral palsy: Secondary | ICD-10-CM | POA: Diagnosis not present

## 2017-09-23 DIAGNOSIS — G800 Spastic quadriplegic cerebral palsy: Secondary | ICD-10-CM | POA: Diagnosis not present

## 2017-10-05 DIAGNOSIS — G800 Spastic quadriplegic cerebral palsy: Secondary | ICD-10-CM | POA: Diagnosis not present

## 2017-10-28 DIAGNOSIS — G800 Spastic quadriplegic cerebral palsy: Secondary | ICD-10-CM | POA: Diagnosis not present

## 2017-11-04 DIAGNOSIS — G800 Spastic quadriplegic cerebral palsy: Secondary | ICD-10-CM | POA: Diagnosis not present

## 2017-11-11 DIAGNOSIS — G800 Spastic quadriplegic cerebral palsy: Secondary | ICD-10-CM | POA: Diagnosis not present

## 2017-11-18 DIAGNOSIS — G800 Spastic quadriplegic cerebral palsy: Secondary | ICD-10-CM | POA: Diagnosis not present

## 2017-11-30 DIAGNOSIS — G800 Spastic quadriplegic cerebral palsy: Secondary | ICD-10-CM | POA: Diagnosis not present

## 2017-12-29 DIAGNOSIS — G800 Spastic quadriplegic cerebral palsy: Secondary | ICD-10-CM | POA: Diagnosis not present

## 2018-01-04 DIAGNOSIS — K080 Exfoliation of teeth due to systemic causes: Secondary | ICD-10-CM | POA: Diagnosis not present

## 2018-04-06 DIAGNOSIS — G800 Spastic quadriplegic cerebral palsy: Secondary | ICD-10-CM | POA: Diagnosis not present

## 2018-05-06 ENCOUNTER — Ambulatory Visit: Payer: Self-pay

## 2018-05-06 NOTE — Telephone Encounter (Signed)
Mom reports pt. Acting "her normal self - talking with people on her I-pad." No complaints. The only thing different is she "is a little sluggish."

## 2018-05-06 NOTE — Telephone Encounter (Signed)
See note

## 2018-05-06 NOTE — Telephone Encounter (Signed)
Mom reports pt.'s last BM was last week. Taking Miralax daily per Mom. No new medications, no diet changes.No complaints. "She seems sluggish." Has 2-3 BM's per week. "Eating and drinking - just not eating as much." Request appointment for tomorrow. Appointment made. Reason for Disposition . Last bowel movement (BM) > 4 days ago  Answer Assessment - Initial Assessment Questions 1. STOOL PATTERN OR FREQUENCY: "How often do you pass bowel movements (BMs)?"  (Normal range: tid to q 3 days)  "When was the last BM passed?"       2-3 times aweek 2. STRAINING: "Do you have to strain to have a BM?"      No 3. RECTAL PAIN: "Does your rectum hurt when the stool comes out?" If so, ask: "Do you have hemorrhoids? How bad is the pain?"  (Scale 1-10; or mild, moderate, severe)     No 4. STOOL COMPOSITION: "Are the stools hard?"      No 5. BLOOD ON STOOLS: "Has there been any blood on the toilet tissue or on the surface of the BM?" If so, ask: "When was the last time?"      No 6. CHRONIC CONSTIPATION: "Is this a new problem for you?"  If no, ask: "How long have you had this problem?" (days, weeks, months)      No 7. CHANGES IN DIET: "Have there been any recent changes in your diet?"      No 8. MEDICATIONS: "Have you been taking any new medications?"     No 9. LAXATIVES: "Have you been using any laxatives or enemas?"  If yes, ask "What, how often, and when was the last time?"     Miralax daily 10. CAUSE: "What do you think is causing the constipation?"        Unsure 11. OTHER SYMPTOMS: "Do you have any other symptoms?" (e.g., abdominal pain, fever, vomiting)       nOT AS ACTIVE 12. PREGNANCY: "Is there any chance you are pregnant?" "When was your last menstrual period?"       No  Protocols used: CONSTIPATION-A-AH

## 2018-05-06 NOTE — Telephone Encounter (Signed)
FYI

## 2018-05-07 ENCOUNTER — Encounter: Payer: Self-pay | Admitting: Physician Assistant

## 2018-05-07 ENCOUNTER — Ambulatory Visit (INDEPENDENT_AMBULATORY_CARE_PROVIDER_SITE_OTHER): Payer: Federal, State, Local not specified - PPO | Admitting: Physician Assistant

## 2018-05-07 VITALS — BP 120/80 | HR 100 | Temp 98.5°F | Wt <= 1120 oz

## 2018-05-07 DIAGNOSIS — K5900 Constipation, unspecified: Secondary | ICD-10-CM | POA: Diagnosis not present

## 2018-05-07 NOTE — Patient Instructions (Signed)
   Use enema today and may also use on Sunday if needed. Continue Miralax.  If no bowel movement by Monday, give our office a call. If any change in symptoms, please seek medical attention (especially if abdominal pain, vomiting, inability to eat/drink.)  Make sure you keep the anus lubricated with vaseline.   How should I use this medicine? This medicine is for rectal use only. Do not take by mouth. Follow the directions on the prescription label. Wash your hands before and after use. Remove tip from enema bottle. Gently insert enema tip into the rectum. Squeeze bottle until almost all of the medicine is inside the rectum. Remove enema tip from the rectum and stay in position until the urge to evacuate is strong. Do not take doses that are larger than those recommended on the product label or otherwise directed by your healthcare professional. Do not take more than one dose in 24 hours.

## 2018-05-07 NOTE — Progress Notes (Signed)
Renee Everett is a 22 y.o. female here for a recurrent problem.  I acted as a Neurosurgeon for [employer], PA-C Corky Mull, LPN  History of Present Illness:   Chief Complaint  Patient presents with  . Constipation    Constipation  This is a recurrent problem. Episode onset: Pt has not been able to move her bowels in 10 days. The problem has been gradually worsening since onset. Her stool frequency is 2 to 3 times per week. The stool is described as loose. The patient is not on a high fiber diet. She does not exercise regularly. There has not been adequate water intake. Associated symptoms include bloating. Pertinent negatives include no abdominal pain, back pain, diarrhea, difficulty urinating, fever, flatus, nausea or rectal pain. She has tried stool softeners (Miralax x 1 week ) for the symptoms. The treatment provided no relief.   This is the longest time that she has gone without having a bowel movement. She is currently on Robinul which may cause constipation -- mother reports that patient has been on this almost all of her life. She is still passing gas, and is eating and drinking, but is eating smaller amounts.  Past Medical History:  Diagnosis Date  . Cerebral palsy [facility])      Social History   Socioeconomic History  . Marital status: Single    Spouse name: Not on file  . Number of children: Not on file  . Years of education: Not on file  . Highest education level: Not on file  Occupational History  . Not on file  Social Needs  . Financial resource strain: Not on file  . Food insecurity:    Worry: Not on file    Inability: Not on file  . Transportation needs:    Medical: Not on file    Non-medical: Not on file  Tobacco Use  . Smoking status: Never Smoker  . Smokeless tobacco: Never Used  Substance and Sexual Activity  . Alcohol use: No  . Drug use: No  . Sexual activity: Never  Lifestyle  . Physical activity:    Days per week: Not on file    Minutes per  session: Not on file  . Stress: Not on file  Relationships  . Social connections:    Talks on phone: Not on file    Gets together: Not on file    Attends religious service: Not on file    Active member of club or organization: Not on file    Attends meetings of clubs or organizations: Not on file    Relationship status: Not on file  . Intimate partner violence:    Fear of current or ex partner: Not on file    Emotionally abused: Not on file    Physically abused: Not on file    Forced sexual activity: Not on file  Other Topics Concern  . Not on file  Social History Narrative  . Not on file    Past Surgical History:  Procedure Laterality Date  . BACK SURGERY      Family History  Problem Relation Age of Onset  . Hypertension Father     No Known Allergies  Current Medications:   Current Outpatient Medications:  .  cetirizine HCl (ZYRTEC) 5 MG/5ML SOLN, Take 10 mLs (10 mg total) by mouth daily., Disp: 300 mL, Rfl: 2 .  clindamycin (CLEOCIN T) 1 % lotion, Apply topically 2 (two) times daily., Disp: , Rfl:  .  glycopyrrolate (ROBINUL) 1  MG tablet, Take 1 mg by mouth 3 (three) times daily., Disp: , Rfl:  .  ranitidine (ZANTAC) 150 MG/10ML syrup, Take 10 mLs (150 mg total) by mouth 2 (two) times daily. (Patient taking differently: Take 150 mg by mouth 2 (two) times daily as needed. ), Disp: 300 mL, Rfl: 0   Review of Systems:   Review of Systems  Constitutional: Negative for fever.  Gastrointestinal: Positive for bloating and constipation. Negative for abdominal pain, diarrhea, flatus, nausea and rectal pain.  Genitourinary: Negative for difficulty urinating.  Musculoskeletal: Negative for back pain.    Vitals:   Vitals:   05/07/18 1323  BP: 120/80  Pulse: 100  Temp: 98.5 F (36.9 C)  TempSrc: Oral  Weight: 67 lb (30.4 kg)     There is no height or weight on file to calculate BMI.  Physical Exam:   Physical Exam  Constitutional: She appears well-developed. She  is cooperative.  Non-toxic appearance. She does not have a sickly appearance. She does not appear ill. No distress.  Cardiovascular: Normal rate, regular rhythm, S1 normal, S2 normal, normal heart sounds and normal pulses.  No LE edema  Pulmonary/Chest: Effort normal and breath sounds normal.  Abdominal: Normal appearance. Bowel sounds are decreased. There is no tenderness. There is no rigidity, no rebound and no guarding.  Neurological: She is alert. GCS eye subscore is 4. GCS verbal subscore is 5. GCS motor subscore is 6.  Skin: Skin is warm, dry and intact.  Psychiatric: She has a normal mood and affect. Her speech is normal and behavior is normal.  Nursing note and vitals reviewed.   Assessment and Plan:    Renee BockChelsea was seen today for constipation.  Diagnoses and all orders for this visit:  Constipation, unspecified constipation type   Dr. Helane RimaErica Wallace also in to see patient.   No red flags on exam. Start Fleets enema -- perform today, may repeat in 2 days. Continue miralax. Push fluids. Keep rectum well lubricated with vaseline. Discussed warning signs. Call office on Monday if still no BM.  Marland Kitchen. Reviewed expectations re: course of current medical issues. . Discussed self-management of symptoms. . Outlined signs and symptoms indicating need for more acute intervention. . Patient verbalized understanding and all questions were answered. . See orders for this visit as documented in the electronic medical record. . Patient received an After-Visit Summary.  CMA or LPN served as scribe during this visit. History, Physical, and Plan performed by medical provider. Documentation and orders reviewed and attested to.   Jarold MottoSamantha Laurel Harnden, PA-C

## 2018-07-07 ENCOUNTER — Ambulatory Visit (INDEPENDENT_AMBULATORY_CARE_PROVIDER_SITE_OTHER): Payer: Federal, State, Local not specified - PPO

## 2018-07-07 DIAGNOSIS — Z23 Encounter for immunization: Secondary | ICD-10-CM | POA: Diagnosis not present

## 2018-08-23 DIAGNOSIS — K080 Exfoliation of teeth due to systemic causes: Secondary | ICD-10-CM | POA: Diagnosis not present

## 2018-11-08 DIAGNOSIS — L709 Acne, unspecified: Secondary | ICD-10-CM | POA: Diagnosis not present

## 2018-11-11 DIAGNOSIS — Z681 Body mass index (BMI) 19 or less, adult: Secondary | ICD-10-CM | POA: Diagnosis not present

## 2018-11-11 DIAGNOSIS — G800 Spastic quadriplegic cerebral palsy: Secondary | ICD-10-CM | POA: Diagnosis not present

## 2019-02-04 IMAGING — DX DG CHEST 1V
1 series · 1 of 1 positions shown · non-contrast
Comparison: None.

CLINICAL DATA: Cough

EXAM:
CHEST 1 VIEW

[chest ap]
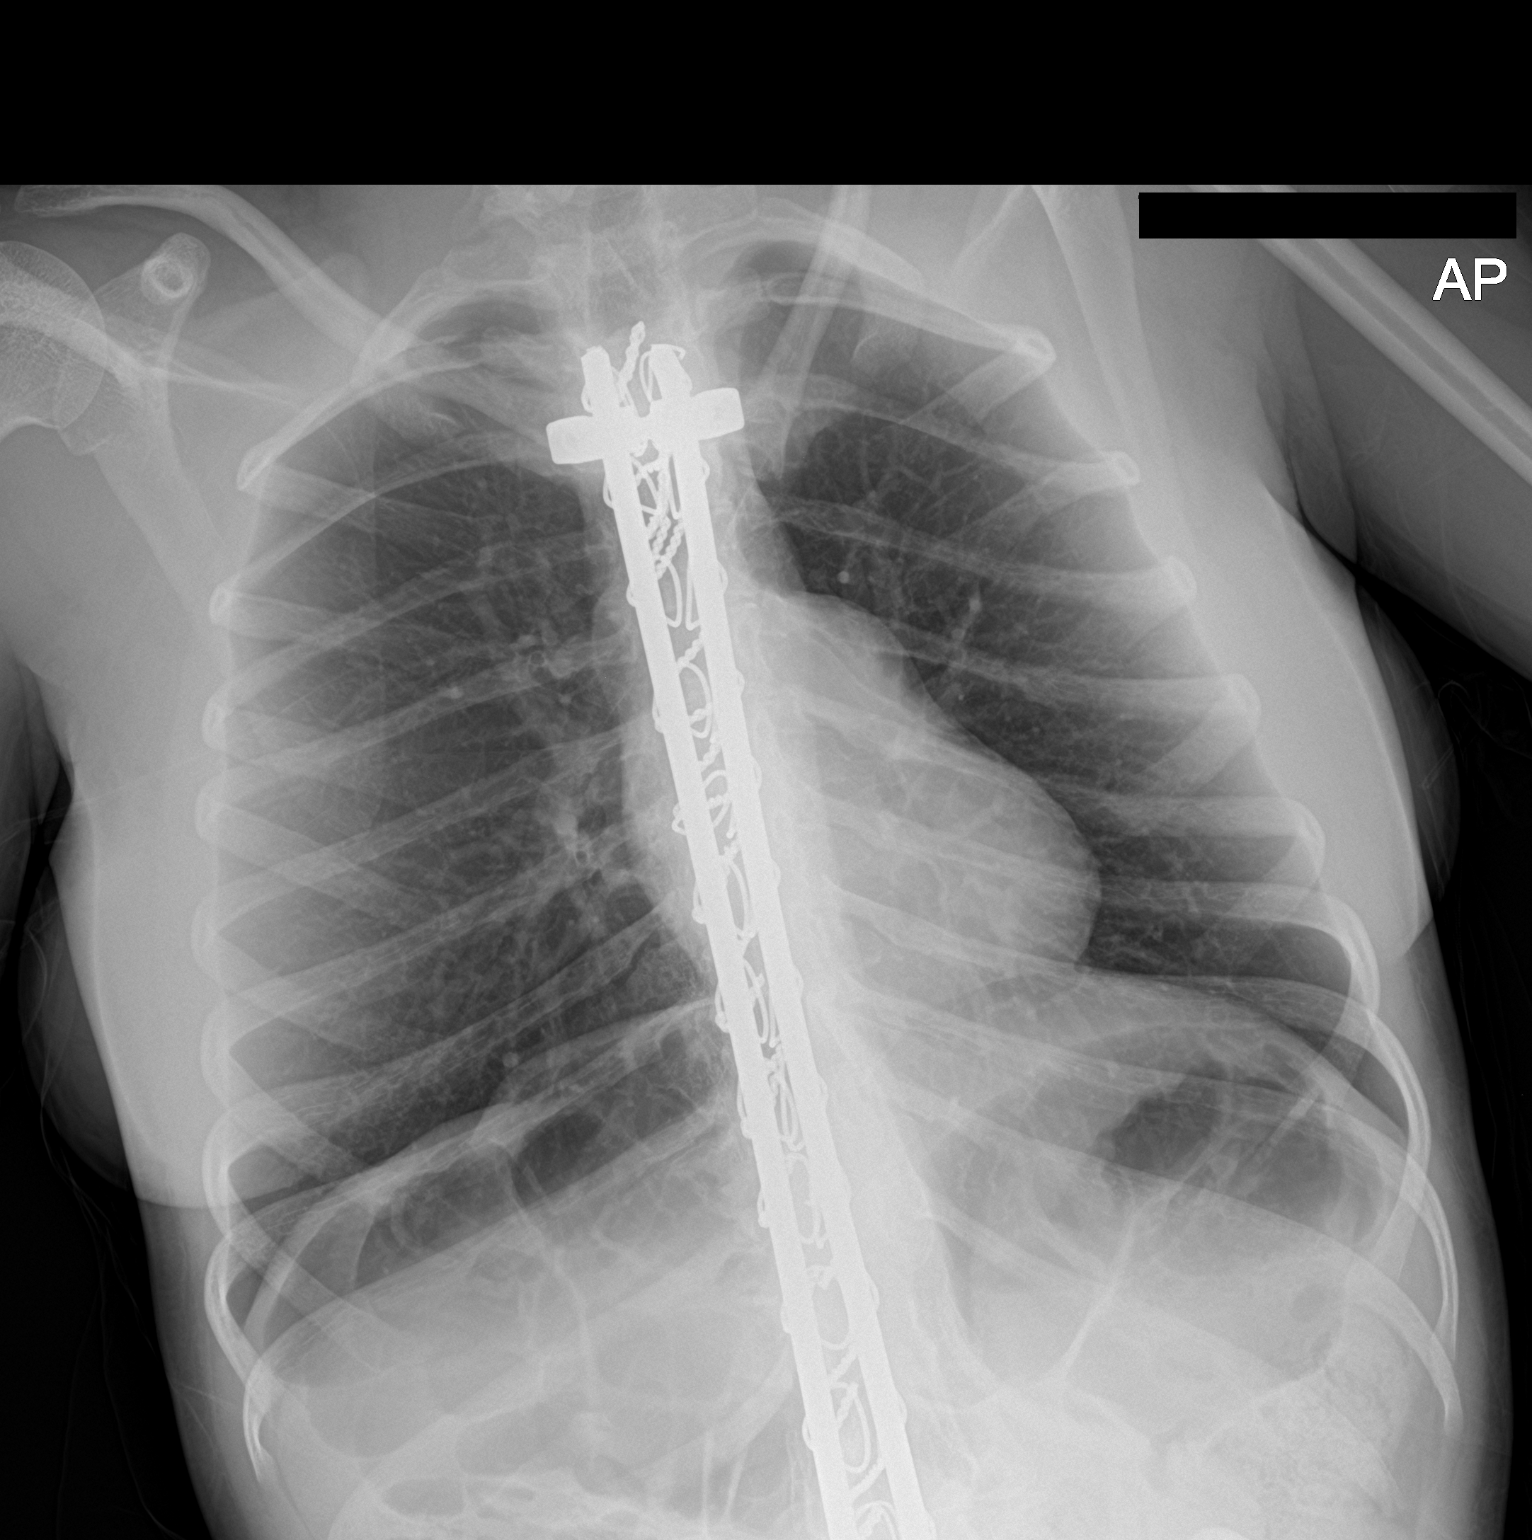

[1 of 1 positions shown; findings below may reference images not displayed]

FINDINGS: Lungs are clear. No effusion or pneumothorax. Prominence of the main
pulmonary artery contour, but hilar vessels are unremarkable and
there is normal vascularity. Spinal fixation hardware. History of
cerebral palsy.
IMPRESSION: No acute finding.  No evidence of pneumonia.

## 2019-03-07 IMAGING — DX DG CHEST 2V
2 series · 2 of 2 positions shown · non-contrast
Comparison: 03/13/2017

CLINICAL DATA: Nonproductive cough

EXAM:
CHEST  2 VIEW

[chest pa]
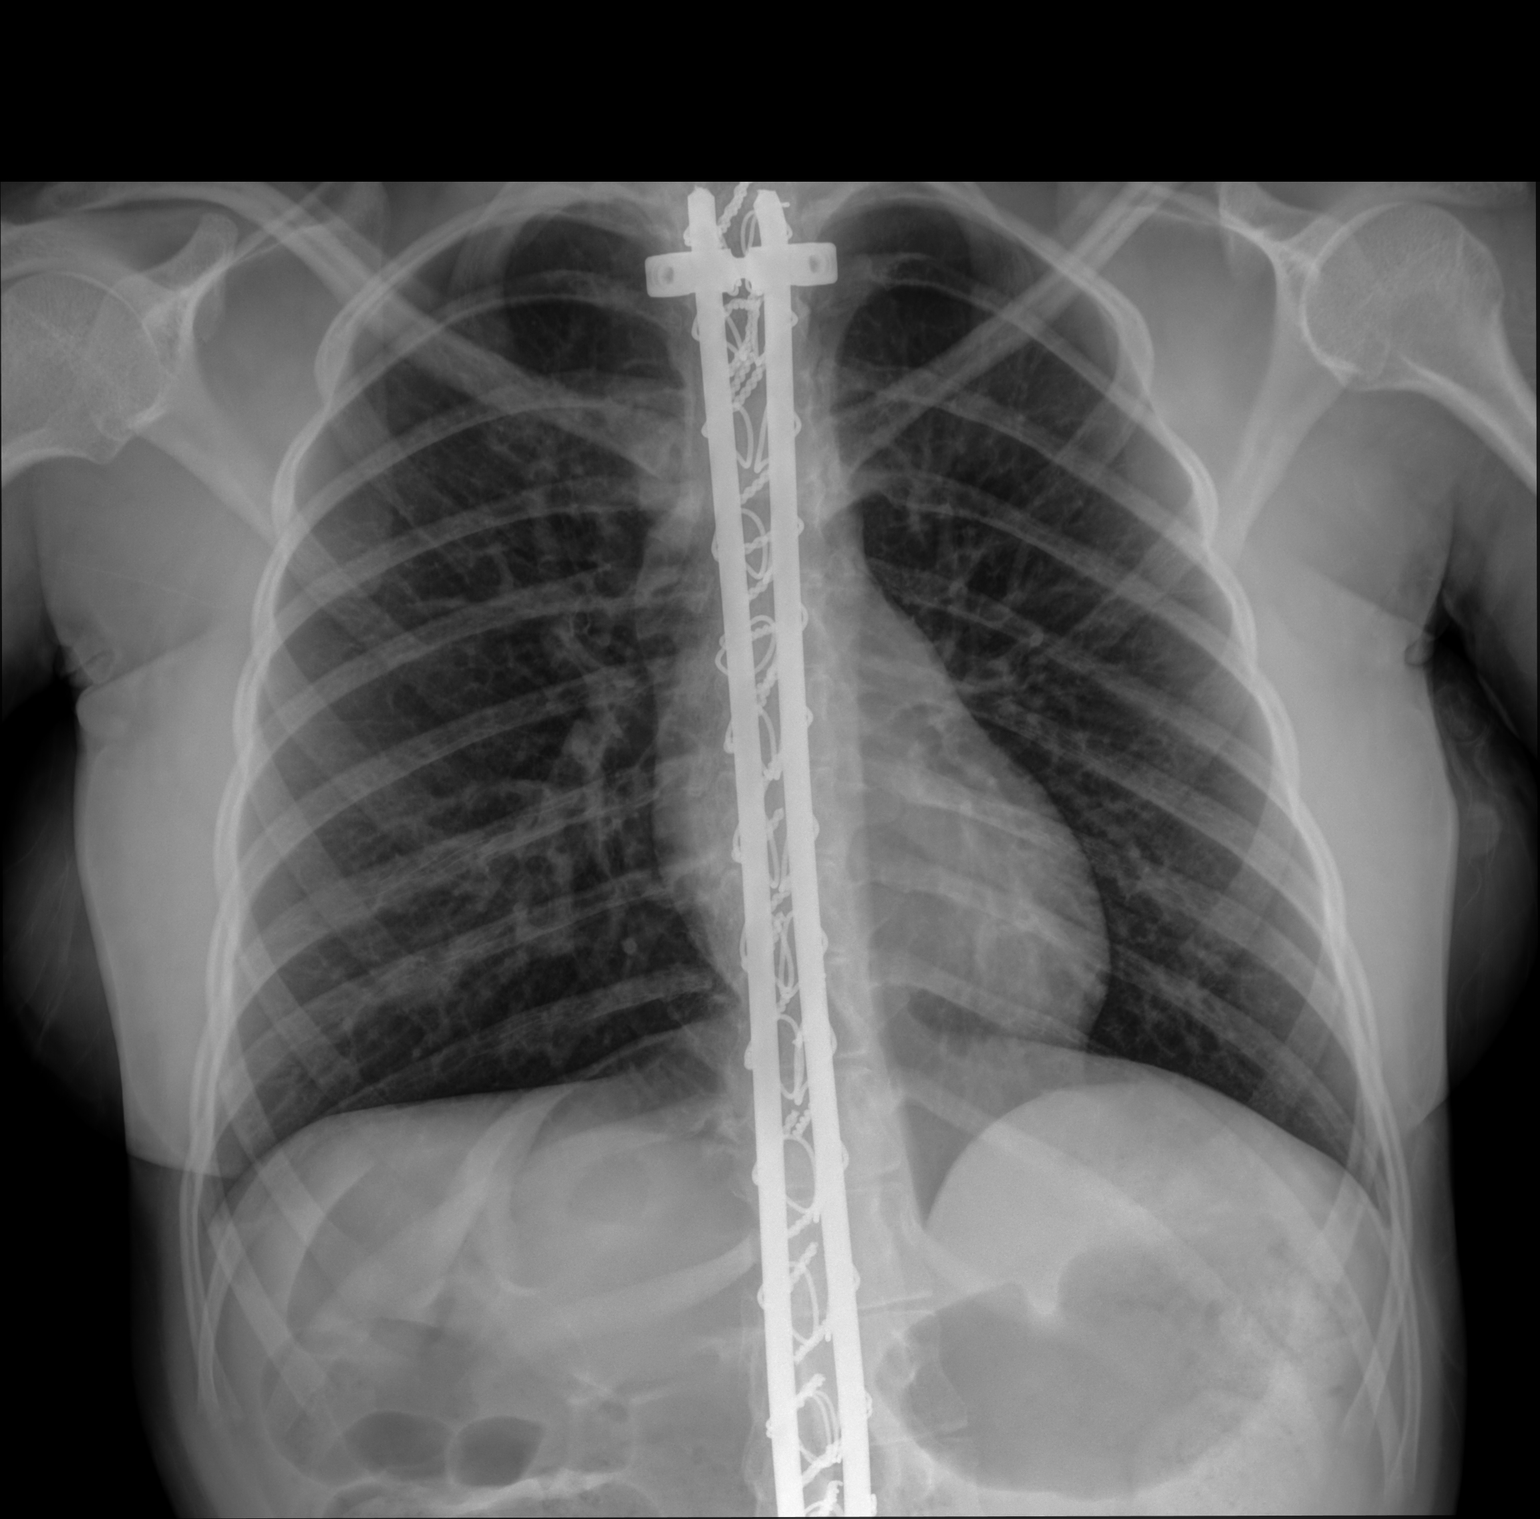

[chest lat]
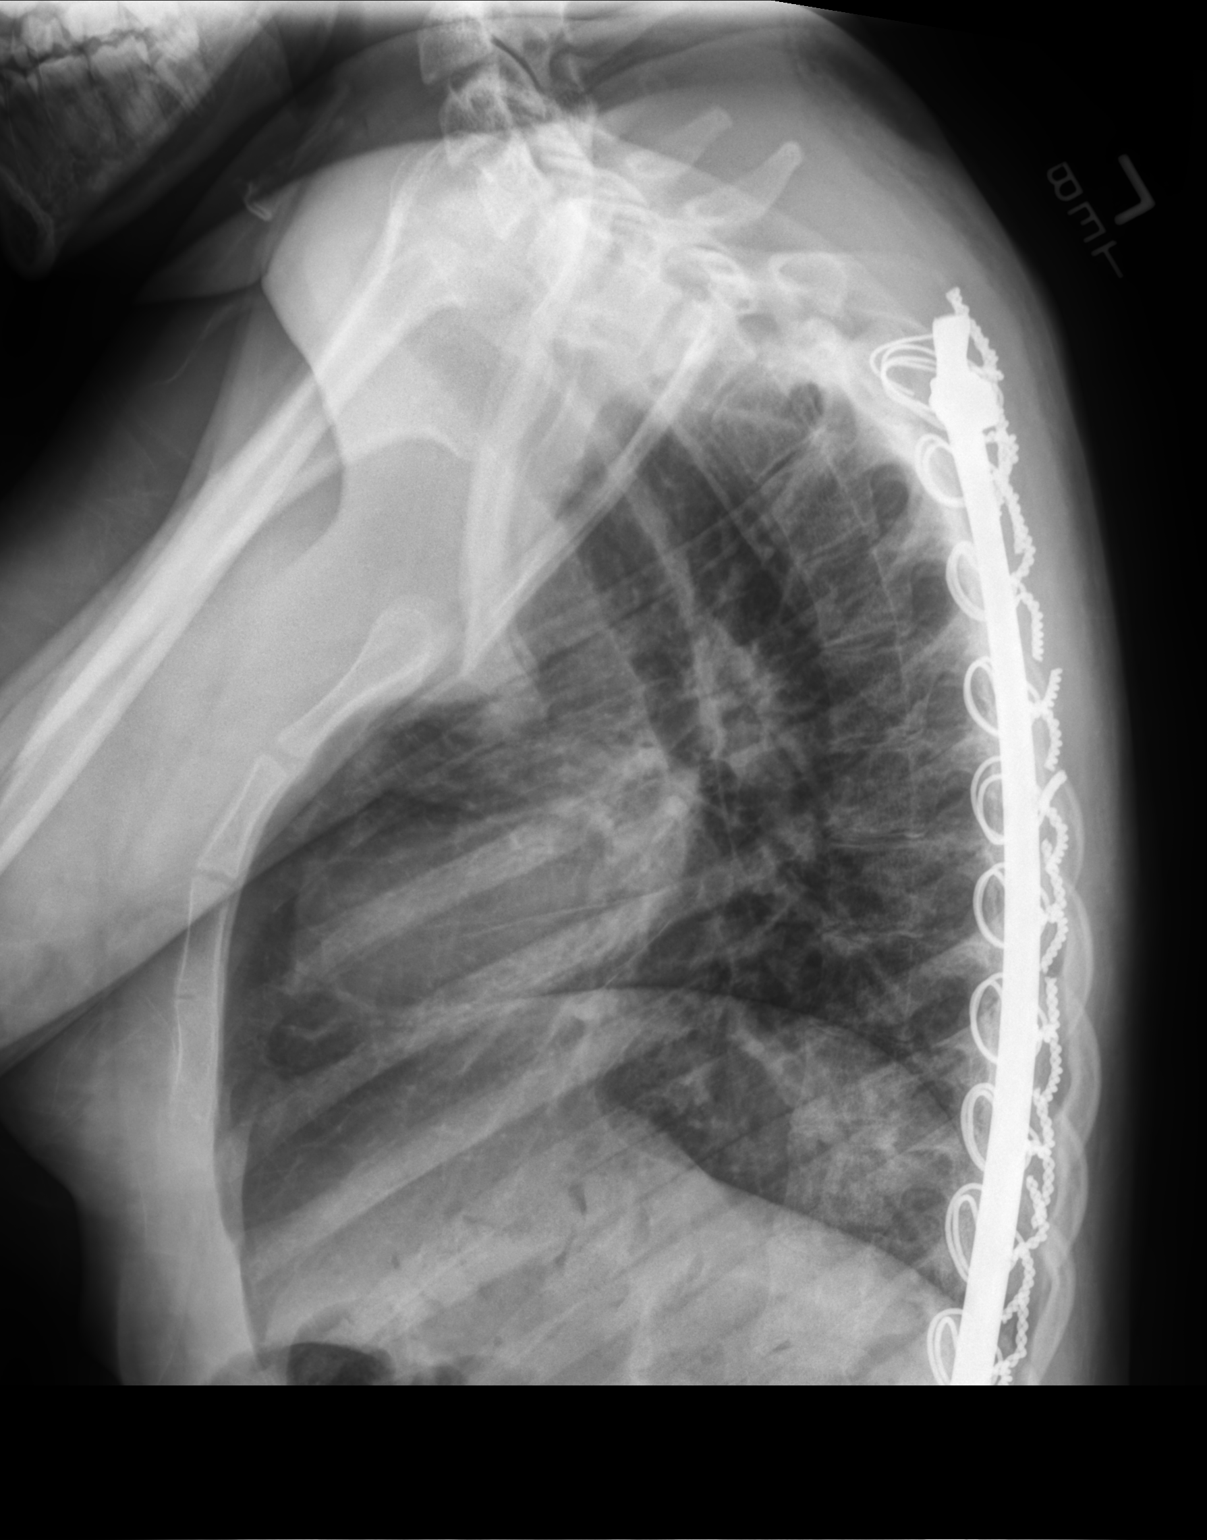

[2 of 2 positions shown; findings below may reference images not displayed]

FINDINGS: Heart and mediastinal contours are within normal limits. No focal
opacities or effusions. No acute bony abnormality. Posterior spinal
rods in place.
IMPRESSION: No active cardiopulmonary disease.

## 2019-06-22 ENCOUNTER — Ambulatory Visit: Payer: Federal, State, Local not specified - PPO

## 2019-07-18 ENCOUNTER — Telehealth: Payer: Self-pay | Admitting: Family Medicine

## 2019-07-18 NOTE — Telephone Encounter (Signed)
Copied from [place] [id number]. Topic: General - Inquiry >> Jul 18, 2019 10:08 AM Alanda Slim E wrote: Reason for CRM: Called about medical necessity form she faxed on 9.29.20/ she will be faxing again today   Please fax back asap to [phone]

## 2019-07-20 NOTE — Telephone Encounter (Signed)
Called patient has not been seen in over a year. We will not be able to fill out form with out some type of evaluation either virtual or in person by provider in our office.

## 2019-09-15 ENCOUNTER — Encounter: Payer: Federal, State, Local not specified - PPO | Admitting: Family Medicine

## 2019-10-28 DIAGNOSIS — L669 Cicatricial alopecia, unspecified: Secondary | ICD-10-CM | POA: Diagnosis not present

## 2019-10-28 DIAGNOSIS — L709 Acne, unspecified: Secondary | ICD-10-CM | POA: Diagnosis not present

## 2019-11-17 DIAGNOSIS — Z0000 Encounter for general adult medical examination without abnormal findings: Secondary | ICD-10-CM | POA: Diagnosis not present

## 2019-12-08 DIAGNOSIS — G800 Spastic quadriplegic cerebral palsy: Secondary | ICD-10-CM | POA: Diagnosis not present

## 2019-12-08 DIAGNOSIS — G249 Dystonia, unspecified: Secondary | ICD-10-CM | POA: Diagnosis not present

## 2020-05-07 DIAGNOSIS — L709 Acne, unspecified: Secondary | ICD-10-CM | POA: Diagnosis not present

## 2020-05-07 DIAGNOSIS — L669 Cicatricial alopecia, unspecified: Secondary | ICD-10-CM | POA: Diagnosis not present

## 2020-06-04 DIAGNOSIS — Z681 Body mass index (BMI) 19 or less, adult: Secondary | ICD-10-CM | POA: Diagnosis not present

## 2020-06-04 DIAGNOSIS — G800 Spastic quadriplegic cerebral palsy: Secondary | ICD-10-CM | POA: Diagnosis not present

## 2020-06-04 DIAGNOSIS — M4140 Neuromuscular scoliosis, site unspecified: Secondary | ICD-10-CM | POA: Diagnosis not present

## 2020-06-28 DIAGNOSIS — Z79899 Other long term (current) drug therapy: Secondary | ICD-10-CM | POA: Diagnosis not present

## 2020-06-28 DIAGNOSIS — G809 Cerebral palsy, unspecified: Secondary | ICD-10-CM | POA: Diagnosis not present

## 2020-06-28 DIAGNOSIS — Z9109 Other allergy status, other than to drugs and biological substances: Secondary | ICD-10-CM | POA: Diagnosis not present

## 2020-06-28 DIAGNOSIS — L568 Other specified acute skin changes due to ultraviolet radiation: Secondary | ICD-10-CM | POA: Diagnosis not present

## 2020-09-07 DIAGNOSIS — G800 Spastic quadriplegic cerebral palsy: Secondary | ICD-10-CM | POA: Diagnosis not present

## 2020-09-18 DIAGNOSIS — G800 Spastic quadriplegic cerebral palsy: Secondary | ICD-10-CM | POA: Diagnosis not present

## 2020-09-18 DIAGNOSIS — M25551 Pain in right hip: Secondary | ICD-10-CM | POA: Diagnosis not present

## 2020-09-18 DIAGNOSIS — Z981 Arthrodesis status: Secondary | ICD-10-CM | POA: Diagnosis not present

## 2020-09-18 DIAGNOSIS — S73015A Posterior dislocation of left hip, initial encounter: Secondary | ICD-10-CM | POA: Diagnosis not present

## 2020-09-18 DIAGNOSIS — Z5189 Encounter for other specified aftercare: Secondary | ICD-10-CM | POA: Diagnosis not present

## 2020-09-18 DIAGNOSIS — Z681 Body mass index (BMI) 19 or less, adult: Secondary | ICD-10-CM | POA: Diagnosis not present

## 2020-09-18 DIAGNOSIS — M247 Protrusio acetabuli: Secondary | ICD-10-CM | POA: Diagnosis not present

## 2020-09-18 DIAGNOSIS — S76211A Strain of adductor muscle, fascia and tendon of right thigh, initial encounter: Secondary | ICD-10-CM | POA: Diagnosis not present

## 2020-09-18 DIAGNOSIS — S73004A Unspecified dislocation of right hip, initial encounter: Secondary | ICD-10-CM | POA: Diagnosis not present

## 2020-09-18 DIAGNOSIS — M21951 Unspecified acquired deformity of right thigh: Secondary | ICD-10-CM | POA: Diagnosis not present

## 2020-09-18 DIAGNOSIS — X58XXXA Exposure to other specified factors, initial encounter: Secondary | ICD-10-CM | POA: Diagnosis not present

## 2020-10-03 DIAGNOSIS — U071 COVID-19: Secondary | ICD-10-CM | POA: Diagnosis not present

## 2020-10-03 DIAGNOSIS — R519 Headache, unspecified: Secondary | ICD-10-CM | POA: Diagnosis not present

## 2020-10-03 DIAGNOSIS — R509 Fever, unspecified: Secondary | ICD-10-CM | POA: Diagnosis not present

## 2020-10-27 DIAGNOSIS — Z20822 Contact with and (suspected) exposure to covid-19: Secondary | ICD-10-CM | POA: Diagnosis not present

## 2021-01-29 DIAGNOSIS — K117 Disturbances of salivary secretion: Secondary | ICD-10-CM | POA: Diagnosis not present

## 2021-01-29 DIAGNOSIS — G800 Spastic quadriplegic cerebral palsy: Secondary | ICD-10-CM | POA: Diagnosis not present

## 2021-01-29 DIAGNOSIS — M4140 Neuromuscular scoliosis, site unspecified: Secondary | ICD-10-CM | POA: Diagnosis not present

## 2021-01-29 DIAGNOSIS — L608 Other nail disorders: Secondary | ICD-10-CM | POA: Diagnosis not present

## 2021-04-26 DIAGNOSIS — R059 Cough, unspecified: Secondary | ICD-10-CM | POA: Diagnosis not present

## 2021-05-07 DIAGNOSIS — G800 Spastic quadriplegic cerebral palsy: Secondary | ICD-10-CM | POA: Diagnosis not present

## 2021-07-08 DIAGNOSIS — G800 Spastic quadriplegic cerebral palsy: Secondary | ICD-10-CM | POA: Diagnosis not present

## 2021-07-08 DIAGNOSIS — L659 Nonscarring hair loss, unspecified: Secondary | ICD-10-CM | POA: Diagnosis not present

## 2021-07-08 DIAGNOSIS — L709 Acne, unspecified: Secondary | ICD-10-CM | POA: Diagnosis not present

## 2021-07-19 DIAGNOSIS — G809 Cerebral palsy, unspecified: Secondary | ICD-10-CM | POA: Diagnosis not present

## 2021-07-19 DIAGNOSIS — Z23 Encounter for immunization: Secondary | ICD-10-CM | POA: Diagnosis not present

## 2021-07-29 DIAGNOSIS — Z681 Body mass index (BMI) 19 or less, adult: Secondary | ICD-10-CM | POA: Diagnosis not present

## 2021-07-29 DIAGNOSIS — G800 Spastic quadriplegic cerebral palsy: Secondary | ICD-10-CM | POA: Diagnosis not present

## 2021-07-29 DIAGNOSIS — K117 Disturbances of salivary secretion: Secondary | ICD-10-CM | POA: Diagnosis not present

## 2021-09-08 DIAGNOSIS — J069 Acute upper respiratory infection, unspecified: Secondary | ICD-10-CM | POA: Diagnosis not present

## 2021-09-08 DIAGNOSIS — Z20822 Contact with and (suspected) exposure to covid-19: Secondary | ICD-10-CM | POA: Diagnosis not present

## 2021-09-09 DIAGNOSIS — R0981 Nasal congestion: Secondary | ICD-10-CM | POA: Diagnosis not present

## 2021-10-09 DIAGNOSIS — K080 Exfoliation of teeth due to systemic causes: Secondary | ICD-10-CM | POA: Diagnosis not present

## 2021-11-25 DIAGNOSIS — G800 Spastic quadriplegic cerebral palsy: Secondary | ICD-10-CM | POA: Diagnosis not present

## 2021-12-05 ENCOUNTER — Encounter: Payer: Self-pay | Admitting: Occupational Therapy

## 2021-12-05 ENCOUNTER — Other Ambulatory Visit: Payer: Self-pay

## 2021-12-05 ENCOUNTER — Ambulatory Visit: Payer: Federal, State, Local not specified - PPO | Attending: Student | Admitting: Occupational Therapy

## 2021-12-05 DIAGNOSIS — M6281 Muscle weakness (generalized): Secondary | ICD-10-CM | POA: Diagnosis not present

## 2021-12-05 DIAGNOSIS — R29898 Other symptoms and signs involving the musculoskeletal system: Secondary | ICD-10-CM | POA: Diagnosis not present

## 2021-12-05 DIAGNOSIS — M25632 Stiffness of left wrist, not elsewhere classified: Secondary | ICD-10-CM | POA: Diagnosis not present

## 2021-12-05 DIAGNOSIS — M25631 Stiffness of right wrist, not elsewhere classified: Secondary | ICD-10-CM | POA: Diagnosis not present

## 2021-12-05 DIAGNOSIS — R278 Other lack of coordination: Secondary | ICD-10-CM | POA: Diagnosis not present

## 2021-12-06 ENCOUNTER — Encounter: Payer: Federal, State, Local not specified - PPO | Admitting: Occupational Therapy

## 2021-12-06 NOTE — Therapy (Signed)
[place] [facility] ?[address] ?[place], [place], [address] ?Phone: [phone]   Fax:  [phone] ? ?Occupational Therapy Evaluation ? ?Patient Details  ?Name: Renee Everett ?MRN: [id number] ?Date of Birth: 05-04-1996 ?Referring Provider (OT): Dr. Court Joy ? ? ?Encounter Date: 12/05/2021 ? ? OT End of Session - 12/06/21 0736   ? ? Visit Number 1   ? Number of Visits 9   ? Authorization Type BCBS federal, Medicaid   ? Authorization Time Period requested 3 inital visits, will need to request additional visits from [facility] following inital 3   ? OT Start Time 1450   ? OT Stop Time 1535   ? OT Time Calculation (min) 45 min   ? Activity Tolerance Patient tolerated treatment well   ? Behavior During Therapy [facility] for tasks assessed/performed   ? ?  ?  ? ?  ? ? ?Past Medical History:  ?Diagnosis Date  ? Cerebral palsy (HCC)   ? ? ?Past Surgical History:  ?Procedure Laterality Date  ? BACK SURGERY    ? ? ?There were no vitals filed for this visit. ? ? Subjective Assessment - 12/05/21 1453   ? ? Subjective  Denies pain   ? Pertinent History Pt is a 26 y.o with spastic quadraplegia, CP, s/p recent botox injection on 11/25/21,   ? Currently in Pain? No/denies   ? ?  ?  ? ?  ? ? ? ? [facility] OT Assessment - 12/05/21 0001   ? ?  ? Assessment  ? Medical Diagnosis spastic CP,  s/p botox 11/25/21   ? Referring Provider (OT) Dr. Court Joy   ? Onset Date/Surgical Date 11/25/21   ?  ? Precautions  ? Precautions None   ?  ? Balance Screen  ? Has the patient fallen in the past 6 months No   ? Has the patient had a decrease in activity level because of a fear of falling?  No   ? Is the patient reluctant to leave their home because of a fear of falling?  No   ?  ? Home  Environment  ? Family/patient expects to be discharged to: Private residence   ? Lives With --   mom, hired caregiver during daytime  ?  ? Prior Function  ? Level of Independence Needs assistance with ADLs;Needs assistance with  transfers   ?  ? ADL  ? ADL comments Pt is dependent for all basic ADLS and transfers, pt is able to type on her I-pad with increased time and  difficulty, she is able to communicate basic info   ?  ? Mobility  ? Mobility Status --   wheelchair  ? Mobility Status Comments dependent for all transfers   ?  ? Cognition  ? Overall Cognitive Status Cognition to be further assessed in functional context PRN   difficult to assess as pt is non verbal  ?  ? Sensation  ? Light Touch Appears Intact   ?  ? Coordination  ? Gross Motor Movements are Fluid and Coordinated No   ? Fine Motor Movements are Fluid and Coordinated No   ?  ? Tone  ? Assessment Location Right Upper Extremity;Left Upper Extremity   ?  ? ROM / Strength  ? AROM / PROM / Strength AROM   ?  ? AROM  ? Overall AROM  Deficits   ? Overall AROM Comments RUE shoulder flexion 110, abduct 85, elbow extension-30, wrist in 85 flexion at rest,  P/ROM to 55 flexion, flexion contracture at PIP joints digits 4, 5 for right hand , LUE shoulder flexion 105, shoulder abduction 85,   elbow extension -20, full elbow flexion, wrist flexed to 80 at rest with ulnar deviation , P/ROM wrist flexed to 40, able to extend all digits.   ?  ? RUE Tone  ? RUE Tone Moderate;Hypertonic   ?  ? LUE Tone  ? LUE Tone Hypertonic;Moderate   ? ?  ?  ? ?  ? ? ? ? ? ? ? ? ? ? ? ? ? ? ? ? ? ? ? ? ? OT Short Term Goals - 12/05/21 1557   ? ?  ? OT SHORT TERM GOAL #1  ? Title I with updated splint wear, care and positioning.   ? Baseline Pt's previous splints no longer fit, pt will benefit from updated splinting for improved UE positioning   ? Time 4   ? Period Weeks   ? Status New   ? Target Date 01/02/22   ? ?  ?  ? ?  ? ? ? ? OT Long Term Goals - 12/05/21 1558   ? ?  ? OT LONG TERM GOAL #1  ? Title Pt's caregiver will be I with HEP for UE stretching.   ? Baseline dependent   ? Time 8   ? Period Weeks   ? Status New   ? Target Date 02/06/22   ?  ? OT LONG TERM GOAL #2  ? Title Pt's caregiver will  verbalize understanding of adapted strategies to increase UE functional use(ie: easier use of I-pad)   ? Baseline Dependent   ? Time 8   ? Period Weeks   ? Status New   ? ?  ?  ? ?  ? ? ? ? ? ? ? ? Plan - 12/05/21 1602   ? ? Clinical Impression Statement Pt is a 25 y.o with spastic quadraplegia, CP, and  s/p recent botox injection on 11/25/21. Pt is mostly non verbal, and is accompanied by her hired caregiver April. Pt presents  with the following :UE  weakness, spasticity, decreased functional mobility, decreased ROM, decreased UE functional use, and decreased coordination. Pt can benefit from skilled occupational therapy to address these deficits in order to maximize pt's safety and I with daily activities and to minimize risk for contracture.   ? OT Occupational Profile and History Problem Focused Assessment - Including review of records relating to presenting problem   ? Occupational performance deficits (Please refer to evaluation for details): ADL's;IADL's;Leisure;Social Participation;Play   ? Body Structure / Function / Physical Skills ADL;Balance;Endurance;Mobility;Strength;Tone;Flexibility;UE functional use;FMC;Coordination;Gait;Decreased knowledge of precautions;GMC;ROM;Decreased knowledge of use of DME;Dexterity;IADL   ? Rehab Potential Good   ? Clinical Decision Making Several treatment options, min-mod task modification necessary   ? Comorbidities Affecting Occupational Performance: May have comorbidities impacting occupational performance   ? Modification or Assistance to Complete Evaluation  Min-Moderate modification of tasks or assist with assess necessary to complete eval   ? OT Frequency 1x / week   1x week x 3 weeks followed by 1x week x 5 weeks  ? OT Duration 8 weeks   ? OT Treatment/Interventions Self-care/ADL training;Therapeutic exercise;Balance training;Splinting;Manual Therapy;Neuromuscular education;Therapeutic activities;DME and/or AE instruction;Paraffin;Fluidtherapy;Patient/family  education;Passive range of motion;Contrast Bath;Moist Heat   ? Plan initiate splinting for UE's to promote increased wrist extension/.   ? Consulted and Agree with Plan of Care Patient;Family member/caregiver   ? ?  ?  ? ?  ? ? ?  Patient will benefit from skilled therapeutic intervention in order to improve the following deficits and impairments:   ?Body Structure / Function / Physical Skills: ADL, Balance, Endurance, Mobility, Strength, Tone, Flexibility, UE functional use, FMC, Coordination, Gait, Decreased knowledge of precautions, GMC, ROM, Decreased knowledge of use of DME, Dexterity, IADL ?  ?  ? ? ?Visit Diagnosis: ?Other symptoms and signs involving the musculoskeletal system - Plan: Ot plan of care cert/re-cert ? ?Muscle weakness - Plan: Ot plan of care cert/re-cert ? ?Other lack of coordination - Plan: Ot plan of care cert/re-cert ? ?Stiffness of left wrist, not elsewhere classified - Plan: Ot plan of care cert/re-cert ? ?Stiffness of right wrist, not elsewhere classified - Plan: Ot plan of care cert/re-cert ? ? ? ?Problem List ?Patient Active Problem List  ? Diagnosis Date Noted  ? Seasonal allergic rhinitis due to pollen 03/13/2017  ? Acne 11/06/2014  ? Cerebral palsy, quadriplegic (HCC) 02/03/2013  ? Contracture of lower leg joint 12/01/2011  ? ? ?Deneshia Zucker, OT ?12/06/2021, 7:38 AM ?Keene Breath, OTR/L ?[phone] ?Phone: [phone] ?7:38 AM 12/06/21  ?[place] [facility] ?[address] ?[place], [place], [address] ?Phone: [phone]   Fax:  [phone] ? ?Name: Renee Everett ?MRN: [id number] ?Date of Birth: 02-26-1996 ? ?

## 2021-12-18 ENCOUNTER — Other Ambulatory Visit: Payer: Self-pay

## 2021-12-18 ENCOUNTER — Ambulatory Visit: Payer: Federal, State, Local not specified - PPO | Admitting: Occupational Therapy

## 2021-12-18 DIAGNOSIS — M6281 Muscle weakness (generalized): Secondary | ICD-10-CM | POA: Diagnosis not present

## 2021-12-18 DIAGNOSIS — R278 Other lack of coordination: Secondary | ICD-10-CM

## 2021-12-18 DIAGNOSIS — M25631 Stiffness of right wrist, not elsewhere classified: Secondary | ICD-10-CM | POA: Diagnosis not present

## 2021-12-18 DIAGNOSIS — M25632 Stiffness of left wrist, not elsewhere classified: Secondary | ICD-10-CM | POA: Diagnosis not present

## 2021-12-18 DIAGNOSIS — R29898 Other symptoms and signs involving the musculoskeletal system: Secondary | ICD-10-CM | POA: Diagnosis not present

## 2021-12-19 ENCOUNTER — Encounter: Payer: Self-pay | Admitting: Occupational Therapy

## 2021-12-19 NOTE — Therapy (Signed)
[facility] ?[address] ?[place], [place], [address] ?Phone: [phone]   Fax:  [phone] ? ?Occupational Therapy Treatment ? ?Patient Details  ?Name: Renee Everett ?MRN: [id number] ?Date of Birth: Aug 30, 1996 ?Referring Provider (OT): Dr. Court Joy ? ? ?Encounter Date: 12/18/2021 ? ? OT End of Session - 12/19/21 0830   ? ? Visit Number 2   ? Number of Visits 9   ? Authorization Type BCBS federal, Medicaid   ? Authorization Time Period 3 visits requested   ? Authorization - Visit Number 1   ? Authorization - Number of Visits 4   ? OT Start Time 1235   ? OT Stop Time 1400   ? OT Time Calculation (min) 85 min   ? Activity Tolerance Patient tolerated treatment well   ? Behavior During Therapy [facility] for tasks assessed/performed   ? ?  ?  ? ?  ? ? ?Past Medical History:  ?Diagnosis Date  ? Cerebral palsy (HCC)   ? ? ?Past Surgical History:  ?Procedure Laterality Date  ? BACK SURGERY    ? ? ?There were no vitals filed for this visit. ? ? Subjective Assessment - 12/19/21 0830   ? ? Subjective  Denies pain   ? Pertinent History Pt is a 26 y.o with spastic quadraplegia, CP, s/p recent botox injection on 11/25/21,   ? Currently in Pain? No/denies   ? ?  ?  ? ?  ? ? ? ? ? ? ? ? ? ? ? ? ? ? ?Treatment: Fabrication and fitting of right wrist and hand splint initiated, increased time required due to severity of contractures and spasticity, 2 therapists assisting with initial fitting.Splint was not issued due to time constraints and increased time required for adjustments. Anticipate issuing splint next visit. ? ? ? ? ? ? ? ? ? ? OT Short Term Goals - 12/05/21 1557   ? ?  ? OT SHORT TERM GOAL #1  ? Title I with updated splint wear, care and positioning.   ? Baseline Pt's previous splints no longer fit, pt will benefit from updated splinting for improved UE positioning   ? Time 4   ? Period Weeks   ? Status New   ? Target Date 01/02/22   ? ?  ?  ? ?  ? ? ? ? OT Long Term  Goals - 12/05/21 1558   ? ?  ? OT LONG TERM GOAL #1  ? Title Pt's caregiver will be I with HEP for UE stretching.   ? Baseline dependent   ? Time 8   ? Period Weeks   ? Status New   ? Target Date 02/06/22   ?  ? OT LONG TERM GOAL #2  ? Title Pt's caregiver will verbalize understanding of adapted strategies to increase UE functional use(ie: easier use of I-pad)   ? Baseline Dependent   ? Time 8   ? Period Weeks   ? Status New   ? ?  ?  ? ?  ? ? ? ? ? ? ? ? ? ?Patient will benefit from skilled therapeutic intervention in order to improve the following deficits and impairments:   ?  ?  ?  ? ? ?Visit Diagnosis: ?Muscle weakness ? ?Other lack of coordination ? ?Other symptoms and signs involving the musculoskeletal system ? ?Stiffness of left wrist, not elsewhere classified ? ?Stiffness of right wrist, not elsewhere classified ? ? ? ?Problem List ?Patient Active Problem List  ?  Diagnosis Date Noted  ? Seasonal allergic rhinitis due to pollen 03/13/2017  ? Acne 11/06/2014  ? Cerebral palsy, quadriplegic (HCC) 02/03/2013  ? Contracture of lower leg joint 12/01/2011  ? ? ?Kylian Loh, OT ?12/19/2021, 8:36 AM ? ?[facility] ?[address] ?[place], [place], [address] ?Phone: [phone]   Fax:  [phone] ? ?Name: Renee Everett ?MRN: [id number] ?Date of Birth: [birth year] ? ?

## 2021-12-25 ENCOUNTER — Ambulatory Visit: Payer: Federal, State, Local not specified - PPO | Admitting: Occupational Therapy

## 2021-12-25 ENCOUNTER — Other Ambulatory Visit: Payer: Self-pay

## 2021-12-25 DIAGNOSIS — R29898 Other symptoms and signs involving the musculoskeletal system: Secondary | ICD-10-CM | POA: Diagnosis not present

## 2021-12-25 DIAGNOSIS — M25631 Stiffness of right wrist, not elsewhere classified: Secondary | ICD-10-CM

## 2021-12-25 DIAGNOSIS — R278 Other lack of coordination: Secondary | ICD-10-CM | POA: Diagnosis not present

## 2021-12-25 DIAGNOSIS — M25632 Stiffness of left wrist, not elsewhere classified: Secondary | ICD-10-CM | POA: Diagnosis not present

## 2021-12-25 DIAGNOSIS — M6281 Muscle weakness (generalized): Secondary | ICD-10-CM | POA: Diagnosis not present

## 2021-12-25 NOTE — Therapy (Signed)
[place] ?[facility] ?[place], [place], [address] ?Phone: [phone]   Fax:  [phone] ? ?Occupational Therapy Treatment ? ?Patient Details  ?Name: Renee Everett ?MRN: [id number] ?Date of Birth: Jul 01, 1996 ?Referring Provider (OT): Dr. Dina Rich ? ? ?Encounter Date: 12/25/2021 ? ? OT End of Session - 12/25/21 1234   ? ? Visit Number 3   ? Number of Visits 9   ? Authorization Type BCBS federal, Medicaid   ? Authorization Time Period 3 visits approved 31/5 - 01/07/22   ? Authorization - Visit Number 2   ? Authorization - Number of Visits 4   ? OT Start Time 1230   ? OT Stop Time 1350   ? OT Time Calculation (min) 80 min   ? Activity Tolerance Patient tolerated treatment well   ? Behavior During Therapy [facility] for tasks assessed/performed   ? ?  ?  ? ?  ? ? ?Past Medical History:  ?Diagnosis Date  ? Cerebral palsy ([place])   ? ? ?Past Surgical History:  ?Procedure Laterality Date  ? BACK SURGERY    ? ? ?There were no vitals filed for this visit. ? ? Subjective Assessment - 12/25/21 1234   ? ? Subjective  Denies pain   ? Pertinent History Pt is a 26 y.o with spastic quadraplegia, CP, s/p recent botox injection on 11/25/21,   ? Currently in Pain? No/denies   ? ?  ?  ? ?  ? ? ?This therapist fabricated and fitted Lt resting hand splint and issued. Reviewed wear and care of both splints extensively with pt and caregiver. Second assisting therapist finished Rt resting hand splint and issued. Practiced donning/doffing splint with caregiver. Caregiver also videotaped proper donning/doffing technique. Verbally reviewed gradually building up tolerance for both splints and recommended alternating splints ? ? ? ? ? ? ? ? ? ? ? ? ? ? ? ? ? ? ? ? OT Education - 12/25/21 1338   ? ? Education Details splint wear and care   ? Person(s) Educated Associate Professor)   ? Methods Explanation;Demonstration;Handout;Verbal cues   caregiver also videotaped proper donning/doffing  ?  Comprehension Verbalized understanding;Returned demonstration   ? ?  ?  ? ?  ? ? ? OT Short Term Goals - 12/25/21 1331   ? ?  ? OT SHORT TERM GOAL #1  ? Title I with updated splint wear, care and positioning.   ? Baseline Pt's previous splints no longer fit, pt will benefit from updated splinting for improved UE positioning   ? Time 4   ? Period Weeks   ? Status On-going   ? Target Date 01/02/22   ? ?  ?  ? ?  ? ? ? ? OT Long Term Goals - 12/05/21 1558   ? ?  ? OT LONG TERM GOAL #1  ? Title Pt's caregiver will be I with HEP for UE stretching.   ? Baseline dependent   ? Time 8   ? Period Weeks   ? Status New   ? Target Date 02/06/22   ?  ? OT LONG TERM GOAL #2  ? Title Pt's caregiver will verbalize understanding of adapted strategies to increase UE functional use(ie: easier use of I-pad)   ? Baseline Dependent   ? Time 8   ? Period Weeks   ? Status New   ? ?  ?  ? ?  ? ? ? ? ? ? ? ? Plan - 12/25/21 1332   ? ?  Clinical Impression Statement Pt progressing towards STG #1.   ? OT Occupational Profile and History Problem Focused Assessment - Including review of records relating to presenting problem   ? Occupational performance deficits (Please refer to evaluation for details): ADL's;IADL's;Leisure;Social Participation;Play   ? Body Structure / Function / Physical Skills ADL;Balance;Endurance;Mobility;Strength;Tone;Flexibility;UE functional use;FMC;Coordination;Gait;Decreased knowledge of precautions;GMC;ROM;Decreased knowledge of use of DME;Dexterity;IADL   ? Rehab Potential Good   ? Clinical Decision Making Several treatment options, min-mod task modification necessary   ? Comorbidities Affecting Occupational Performance: May have comorbidities impacting occupational performance   ? Modification or Assistance to Complete Evaluation  Min-Moderate modification of tasks or assist with assess necessary to complete eval   ? OT Frequency 1x / week   1x week x 3 weeks followed by 1x week x 5 weeks  ? OT Duration 8 weeks   ? OT  Treatment/Interventions Self-care/ADL training;Therapeutic exercise;Balance training;Splinting;Manual Therapy;Neuromuscular education;Therapeutic activities;DME and/or AE instruction;Paraffin;Fluidtherapy;Patient/family education;Passive range of motion;Contrast Bath;Moist Heat   ? Plan splint adjustments prn, continue progress towards LTG's   ? Consulted and Agree with Plan of Care Patient;Family member/caregiver   ? ?  ?  ? ?  ? ? ?Patient will benefit from skilled therapeutic intervention in order to improve the following deficits and impairments:   ?Body Structure / Function / Physical Skills: ADL, Balance, Endurance, Mobility, Strength, Tone, Flexibility, UE functional use, FMC, Coordination, Gait, Decreased knowledge of precautions, GMC, ROM, Decreased knowledge of use of DME, Dexterity, IADL ?  ?  ? ? ?Visit Diagnosis: ?Stiffness of left wrist, not elsewhere classified ? ?Stiffness of right wrist, not elsewhere classified ? ? ? ?Problem List ?Patient Active Problem List  ? Diagnosis Date Noted  ? Seasonal allergic rhinitis due to pollen 03/13/2017  ? Acne 11/06/2014  ? Cerebral palsy, quadriplegic ([place]) 02/03/2013  ? Contracture of lower leg joint 12/01/2011  ? ? ?Carey Bullocks, OTR/L ?12/25/2021, 1:49 PM ? ?[place] ?[place] ?[place], [place], [address] ?Phone: [phone]   Fax:  [phone] ? ?Name: Renee Everett ?MRN: [id number] ?Date of Birth: 05-Mar-1996 ? ?

## 2021-12-25 NOTE — Patient Instructions (Signed)
?  SPLINT WEAR AND CARE ? ?Your Splint ?This splint should initially be fitted by a healthcare practitioner.  The healthcare practitioner is responsible for providing wearing instructions and precautions to the patient, other healthcare practitioners and care provider involved in the patient's care.  This splint was custom made for you. Please read the following instructions to learn about wearing and caring for your splint. ? ?Precautions ?Should your splint cause any of the following problems, remove the splint immediately and contact your therapist/physician. ?Swelling ?Severe Pain ?Pressure Areas ?Stiffness ?Numbness ? ? ?When To Wear Your Splint ?Where your splint according to your therapist/physician instructions. ?Remove splint every 1-2 hours (may need to check more often for Rt splint) during the day to check for pressure sores, blisters. A general redness that fades within 10-15 minutes is fine. Will need to alternate splints. Gradually increase wearing time during the day by adding 30 minutes each time you put on. It may take 1-2 weeks where she can fully tolerate splints. She may eventually be able to wear Lt splint more at night, and Rt splint more during the day. However, before switching to night for Lt splint, make sure she can tolerate at least 4 consecutive hours during the day w/ no problems.  ? ?Care and Cleaning of Your Splint ?Keep your splint away from open flames and extreme heat. ?Your splint will lose its shape in temperatures over 135 degrees Farenheit, ( in car windows, near radiators, ovens or in hot water).  Never make any adjustments to your splint, if the splint needs adjusting remove it and make an appointment to see your therapist. ?Your splint may be cleaned with rubbing alcohol 1-2 times per day.  Do not immerse in hot water over 135 degrees Farenheit. ? ?

## 2022-01-01 ENCOUNTER — Ambulatory Visit: Payer: Federal, State, Local not specified - PPO | Admitting: Occupational Therapy

## 2022-01-01 ENCOUNTER — Encounter: Payer: Self-pay | Admitting: Occupational Therapy

## 2022-01-01 DIAGNOSIS — R278 Other lack of coordination: Secondary | ICD-10-CM

## 2022-01-01 DIAGNOSIS — M25632 Stiffness of left wrist, not elsewhere classified: Secondary | ICD-10-CM | POA: Diagnosis not present

## 2022-01-01 DIAGNOSIS — M25631 Stiffness of right wrist, not elsewhere classified: Secondary | ICD-10-CM | POA: Diagnosis not present

## 2022-01-01 DIAGNOSIS — R29898 Other symptoms and signs involving the musculoskeletal system: Secondary | ICD-10-CM

## 2022-01-01 DIAGNOSIS — M6281 Muscle weakness (generalized): Secondary | ICD-10-CM | POA: Diagnosis not present

## 2022-01-01 NOTE — Patient Instructions (Addendum)
Splint wear schedule- ?Gradually increase the wear time of Ayauna's left splint to 3-4 hours at a time during the daytime, once she is able to tolerate for 4 hours at a time without any skin issues or pressure areas, she can progress to wearing left splint at night. ? ?For right splint progress to wearing for splint for 3 hours at a time during daytime. She will continue to wear right splint only during day. ? ?Continue to monitor skin closely for pressure areas and redness, stop wearing splint if any issues and bring to next therapy appointment. ? ? ? ? ? ? ? ? ? ? ? ? ? ? ? ? ? ? ? ?Extension: ROM ? ? ? ?Position (A) Helper: Stabilize left forearm AND WRIST . Grasp hand under palm. Motion (B) -Lift hand back, bending at wrist. Caregiver may need to  ?CAUTION: Do not push into wrist joint. Repeat _20_ times. Repeat with other arm. Do _1-2__ sessions per day.  ?Copyright ? VHI. All rights reserved.  ? ? ? ?CAREGIVER ASSISTED: Elbow Extension ? ? ? ?Have Shaterria assist with straightening  if possible, Caregiver straightens elbow gently, then [facility] can actively bend elbow Patient looks at arm. Hold __5_ seconds. __10-20_ reps per set, __1-2_ sets per day, _5__ days per week ? ? ? ? ?Active ROM Flexion ? ? ? ?One arm straight, at side of chair, make fist, thumb up. Then slowly raise arm toward ceiling. Attempt to keep shoulder blades together. Hold __5_ seconds. ?Repeat __10-20_ times each arm, alternating. Do __1-2_ sessions per day. ? ?Copyright ? VHI. All rights reserved.  ? ? ? ?Copyright ? VHI. All rights reserved.  ? ? ?

## 2022-01-01 NOTE — Therapy (Signed)
[place] ?[place] ?[place], [place], [address] ?Phone: [phone]   Fax:  [phone] ? ?Occupational Therapy Evaluation ? ?Patient Details  ?Name: Renee Everett ?MRN: [id number] ?Date of Birth: 1996/02/13 ?Referring Provider (OT): Dr. Dina Rich ? ? ?Encounter Date: 01/01/2022 ? ? OT End of Session - 01/01/22 1244   ? ? Visit Number 4   ? Number of Visits 9   ? Authorization Type BCBS federal, Medicaid   ? Authorization Time Period 3 visits approved 31/5 - 01/07/22   ? Authorization - Visit Number 3   ? Authorization - Number of Visits 4   ? OT Start Time 1233   ? OT Stop Time 1400   ? OT Time Calculation (min) 87 min   ? Activity Tolerance Patient tolerated treatment well   ? Behavior During Therapy [facility] for tasks assessed/performed   ? ?  ?  ? ?  ? ? ?Past Medical History:  ?Diagnosis Date  ? Cerebral palsy ([place])   ? ? ?Past Surgical History:  ?Procedure Laterality Date  ? BACK SURGERY    ? ? ?There were no vitals filed for this visit. ? ? Subjective Assessment - 01/01/22 1243   ? ? Subjective  Pt's caregiever reports that splint wear is going well.   ? Pertinent History Pt is a 26 y.o with spastic quadraplegia, CP, s/p recent botox injection on 11/25/21,   ? Currently in Pain? No/denies   ? ?  ?  ? ?  ? ? ? ? ? ? ? ? ? ? ? ?Treatment: Splints were applied by therapist to reassess fit. Pt's splints appear to be fitting well and pt's caregiver states that pt is wearing without issues for several hours during the daytime. ?Pt's caregiver was instructed in initial HEP. ? ? ? ? ? ? ? ? ? ? OT Education - 01/01/22 1345   ? ? Education Details Therapist reveiwed splint wear, care and precautions with pt/ caregiver, education provided regarding inital HEP.   ? Person(s) Educated Associate Professor)   ? Methods Explanation;Demonstration;Handout;Verbal cues   ? Comprehension Verbalized understanding;Returned demonstration;Verbal cues required   ? ?  ?  ? ?   ? ? ? OT Short Term Goals - 01/01/22 1244   ? ?  ? OT SHORT TERM GOAL #1  ? Title I with updated splint wear, care and positioning.   ? Baseline Pt's previous splints no longer fit, pt will benefit from updated splinting for improved UE positioning   ? Time 4   ? Period Weeks   ? Status Achieved   ? Target Date 01/02/22   ? ?  ?  ? ?  ? ? ? ? OT Long Term Goals - 01/01/22 1330   ? ?  ? OT LONG TERM GOAL #1  ? Title Pt's caregiver will be I with HEP for UE stretching.   ? Baseline inital HEP issued, however pt's caregiver can benefit from reinforcement, and pt will benefit from progression of HEP   ? Time 8   ? Period Weeks   ? Status New   ? Target Date 02/06/22   ?  ? OT LONG TERM GOAL #2  ? Title Pt's caregiver will verbalize understanding of adapted strategies to increase UE functional use(ie: easier use of I-pad)   ? Baseline dependent- not addressed yet, pt only seen for inital  3 visits and splinting was addressed   ? Time 8   ? Period Weeks   ?  Status New   ? ?  ?  ? ?  ? ? ? ? ? ? ? ? Plan - 01/01/22 1346   ? ? Clinical Impression Statement Pt has met her short term goal. She is wearing both splints for several hours per day without issues.  Pt can benefit from continued skilled occupational therapy to address HEP for stretching and UE functional use, as well as adapted strategies for UE functional use.   ? OT Occupational Profile and History Problem Focused Assessment - Including review of records relating to presenting problem   ? Occupational performance deficits (Please refer to evaluation for details): ADL's;IADL's;Leisure;Social Participation;Play   ? Body Structure / Function / Physical Skills ADL;Balance;Endurance;Mobility;Strength;Tone;Flexibility;UE functional use;FMC;Coordination;Gait;Decreased knowledge of precautions;GMC;ROM;Decreased knowledge of use of DME;Dexterity;IADL   ? Rehab Potential Good   ? Clinical Decision Making Several treatment options, min-mod task modification necessary   ?  Comorbidities Affecting Occupational Performance: May have comorbidities impacting occupational performance   ? Modification or Assistance to Complete Evaluation  Min-Moderate modification of tasks or assist with assess necessary to complete eval   ? OT Frequency 1x / week   1x week x 3 weeks followed by 1x week x 5 weeks  ? OT Duration 8 weeks   ? OT Treatment/Interventions Self-care/ADL training;Therapeutic exercise;Balance training;Splinting;Manual Therapy;Neuromuscular education;Therapeutic activities;DME and/or AE instruction;Paraffin;Fluidtherapy;Patient/family education;Passive range of motion;Contrast Bath;Moist Heat   ? Plan splint check prn, review and progress HEP's, adapted strategies for UE functional use   ? Consulted and Agree with Plan of Care Patient;Family member/caregiver   ? Family Member Consulted hired caregiver.   ? ?  ?  ? ?  ? ? ?Patient will benefit from skilled therapeutic intervention in order to improve the following deficits and impairments:   ?Body Structure / Function / Physical Skills: ADL, Balance, Endurance, Mobility, Strength, Tone, Flexibility, UE functional use, FMC, Coordination, Gait, Decreased knowledge of precautions, GMC, ROM, Decreased knowledge of use of DME, Dexterity, IADL ?  ?  ? ? ?Visit Diagnosis: ?Stiffness of left wrist, not elsewhere classified ? ?Stiffness of right wrist, not elsewhere classified ? ?Muscle weakness ? ?Other lack of coordination ? ?Other symptoms and signs involving the musculoskeletal system ? ? ? ?Problem List ?Patient Active Problem List  ? Diagnosis Date Noted  ? Seasonal allergic rhinitis due to pollen 03/13/2017  ? Acne 11/06/2014  ? Cerebral palsy, quadriplegic ([place]) 02/03/2013  ? Contracture of lower leg joint 12/01/2011  ? ? ?Ronald Londo, OT ?01/01/2022, 1:49 PM ? ?[place] ?[place] ?[place], [place], [address] ?Phone: [phone]   Fax:  [phone] ? ?Name: Renee Everett ?MRN: [id number] ?Date of Birth: [birth year] ? ?

## 2022-02-19 ENCOUNTER — Ambulatory Visit: Payer: Federal, State, Local not specified - PPO | Attending: Student | Admitting: Occupational Therapy

## 2022-02-26 ENCOUNTER — Ambulatory Visit: Payer: Federal, State, Local not specified - PPO | Admitting: Occupational Therapy

## 2022-02-28 DIAGNOSIS — G800 Spastic quadriplegic cerebral palsy: Secondary | ICD-10-CM | POA: Diagnosis not present

## 2022-03-05 ENCOUNTER — Ambulatory Visit: Payer: Federal, State, Local not specified - PPO | Admitting: Occupational Therapy

## 2022-03-12 ENCOUNTER — Ambulatory Visit: Payer: Federal, State, Local not specified - PPO | Admitting: Occupational Therapy

## 2022-03-14 DIAGNOSIS — G800 Spastic quadriplegic cerebral palsy: Secondary | ICD-10-CM | POA: Diagnosis not present

## 2022-04-03 DIAGNOSIS — Z0000 Encounter for general adult medical examination without abnormal findings: Secondary | ICD-10-CM | POA: Diagnosis not present

## 2022-04-03 DIAGNOSIS — Z13220 Encounter for screening for lipoid disorders: Secondary | ICD-10-CM | POA: Diagnosis not present

## 2022-04-03 DIAGNOSIS — Z1159 Encounter for screening for other viral diseases: Secondary | ICD-10-CM | POA: Diagnosis not present

## 2022-05-05 DIAGNOSIS — K036 Deposits [accretions] on teeth: Secondary | ICD-10-CM | POA: Diagnosis not present

## 2022-07-04 DIAGNOSIS — G809 Cerebral palsy, unspecified: Secondary | ICD-10-CM | POA: Diagnosis not present

## 2022-07-04 DIAGNOSIS — G800 Spastic quadriplegic cerebral palsy: Secondary | ICD-10-CM | POA: Diagnosis not present

## 2022-07-04 DIAGNOSIS — M62838 Other muscle spasm: Secondary | ICD-10-CM | POA: Diagnosis not present

## 2022-07-04 DIAGNOSIS — M4140 Neuromuscular scoliosis, site unspecified: Secondary | ICD-10-CM | POA: Diagnosis not present

## 2022-08-14 DIAGNOSIS — H01009 Unspecified blepharitis unspecified eye, unspecified eyelid: Secondary | ICD-10-CM | POA: Diagnosis not present

## 2022-09-25 DIAGNOSIS — M4140 Neuromuscular scoliosis, site unspecified: Secondary | ICD-10-CM | POA: Diagnosis not present

## 2022-09-25 DIAGNOSIS — S73014S Posterior dislocation of right hip, sequela: Secondary | ICD-10-CM | POA: Diagnosis not present

## 2022-09-25 DIAGNOSIS — G809 Cerebral palsy, unspecified: Secondary | ICD-10-CM | POA: Diagnosis not present

## 2022-09-25 DIAGNOSIS — G800 Spastic quadriplegic cerebral palsy: Secondary | ICD-10-CM | POA: Diagnosis not present

## 2022-09-30 DIAGNOSIS — G800 Spastic quadriplegic cerebral palsy: Secondary | ICD-10-CM | POA: Diagnosis not present

## 2022-10-20 DIAGNOSIS — G800 Spastic quadriplegic cerebral palsy: Secondary | ICD-10-CM | POA: Diagnosis not present

## 2022-10-31 DIAGNOSIS — G800 Spastic quadriplegic cerebral palsy: Secondary | ICD-10-CM | POA: Diagnosis not present

## 2022-11-10 DIAGNOSIS — Z9889 Other specified postprocedural states: Secondary | ICD-10-CM | POA: Diagnosis not present

## 2022-11-10 DIAGNOSIS — S73016A Posterior dislocation of unspecified hip, initial encounter: Secondary | ICD-10-CM | POA: Diagnosis not present

## 2022-11-10 DIAGNOSIS — X58XXXS Exposure to other specified factors, sequela: Secondary | ICD-10-CM | POA: Diagnosis not present

## 2022-11-10 DIAGNOSIS — G800 Spastic quadriplegic cerebral palsy: Secondary | ICD-10-CM | POA: Diagnosis not present

## 2022-11-10 DIAGNOSIS — S73014S Posterior dislocation of right hip, sequela: Secondary | ICD-10-CM | POA: Diagnosis not present

## 2022-11-10 DIAGNOSIS — Z0120 Encounter for dental examination and cleaning without abnormal findings: Secondary | ICD-10-CM | POA: Diagnosis not present

## 2022-11-10 DIAGNOSIS — M4140 Neuromuscular scoliosis, site unspecified: Secondary | ICD-10-CM | POA: Diagnosis not present

## 2023-02-27 DIAGNOSIS — M24531 Contracture, right wrist: Secondary | ICD-10-CM | POA: Diagnosis not present

## 2023-02-27 DIAGNOSIS — G800 Spastic quadriplegic cerebral palsy: Secondary | ICD-10-CM | POA: Diagnosis not present

## 2023-02-27 DIAGNOSIS — M24532 Contracture, left wrist: Secondary | ICD-10-CM | POA: Diagnosis not present

## 2023-05-11 DIAGNOSIS — Z0120 Encounter for dental examination and cleaning without abnormal findings: Secondary | ICD-10-CM | POA: Diagnosis not present

## 2023-10-21 DIAGNOSIS — L709 Acne, unspecified: Secondary | ICD-10-CM | POA: Diagnosis not present

## 2023-10-21 DIAGNOSIS — L219 Seborrheic dermatitis, unspecified: Secondary | ICD-10-CM | POA: Diagnosis not present

## 2023-11-06 DIAGNOSIS — G800 Spastic quadriplegic cerebral palsy: Secondary | ICD-10-CM | POA: Diagnosis not present

## 2023-12-28 DIAGNOSIS — M24532 Contracture, left wrist: Secondary | ICD-10-CM | POA: Diagnosis not present

## 2023-12-28 DIAGNOSIS — M24531 Contracture, right wrist: Secondary | ICD-10-CM | POA: Diagnosis not present

## 2023-12-28 DIAGNOSIS — G800 Spastic quadriplegic cerebral palsy: Secondary | ICD-10-CM | POA: Diagnosis not present

## 2024-07-11 DIAGNOSIS — H66002 Acute suppurative otitis media without spontaneous rupture of ear drum, left ear: Secondary | ICD-10-CM | POA: Diagnosis not present

## 2024-07-11 DIAGNOSIS — Z20822 Contact with and (suspected) exposure to covid-19: Secondary | ICD-10-CM | POA: Diagnosis not present

## 2024-07-11 DIAGNOSIS — J029 Acute pharyngitis, unspecified: Secondary | ICD-10-CM | POA: Diagnosis not present
